# Patient Record
Sex: Female | Born: 1945 | Race: White | Hispanic: No | Smoking: Never smoker
Health system: Southern US, Community
[De-identification: ages and names within clinical notes are randomized; demographics above are authoritative.]

## PROBLEM LIST (undated history)

## (undated) DIAGNOSIS — I10 Essential (primary) hypertension: Secondary | ICD-10-CM

## (undated) DIAGNOSIS — M1990 Unspecified osteoarthritis, unspecified site: Secondary | ICD-10-CM

## (undated) DIAGNOSIS — F329 Major depressive disorder, single episode, unspecified: Secondary | ICD-10-CM

## (undated) DIAGNOSIS — K219 Gastro-esophageal reflux disease without esophagitis: Secondary | ICD-10-CM

## (undated) DIAGNOSIS — D649 Anemia, unspecified: Secondary | ICD-10-CM

## (undated) DIAGNOSIS — F419 Anxiety disorder, unspecified: Secondary | ICD-10-CM

## (undated) DIAGNOSIS — E785 Hyperlipidemia, unspecified: Secondary | ICD-10-CM

## (undated) DIAGNOSIS — F32A Depression, unspecified: Secondary | ICD-10-CM

## (undated) DIAGNOSIS — J189 Pneumonia, unspecified organism: Secondary | ICD-10-CM

## (undated) DIAGNOSIS — L409 Psoriasis, unspecified: Secondary | ICD-10-CM

## (undated) DIAGNOSIS — Z9189 Other specified personal risk factors, not elsewhere classified: Secondary | ICD-10-CM

## (undated) DIAGNOSIS — H269 Unspecified cataract: Secondary | ICD-10-CM

## (undated) DIAGNOSIS — IMO0002 Reserved for concepts with insufficient information to code with codable children: Secondary | ICD-10-CM

## (undated) DIAGNOSIS — M329 Systemic lupus erythematosus, unspecified: Secondary | ICD-10-CM

## (undated) DIAGNOSIS — N393 Stress incontinence (female) (male): Secondary | ICD-10-CM

## (undated) HISTORY — DX: Other specified personal risk factors, not elsewhere classified: Z91.89

## (undated) HISTORY — DX: Anxiety disorder, unspecified: F41.9

## (undated) HISTORY — DX: Anemia, unspecified: D64.9

## (undated) HISTORY — PX: TOTAL HIP ARTHROPLASTY: SHX124

## (undated) HISTORY — DX: Unspecified cataract: H26.9

## (undated) HISTORY — PX: CATARACT EXTRACTION W/ INTRAOCULAR LENS IMPLANT: SHX1309

## (undated) HISTORY — DX: Systemic lupus erythematosus, unspecified: M32.9

## (undated) HISTORY — DX: Reserved for concepts with insufficient information to code with codable children: IMO0002

## (undated) HISTORY — PX: AUGMENTATION MAMMAPLASTY: SUR837

## (undated) HISTORY — DX: Pneumonia, unspecified organism: J18.9

## (undated) HISTORY — PX: CATARACT EXTRACTION: SUR2

---

## 1999-04-04 ENCOUNTER — Emergency Department (HOSPITAL_COMMUNITY): Admission: EM | Admit: 1999-04-04 | Discharge: 1999-04-04 | Payer: Self-pay | Admitting: Emergency Medicine

## 2000-06-04 HISTORY — PX: PUBOVAGINAL SLING: SHX1035

## 2000-12-18 ENCOUNTER — Encounter: Admission: RE | Admit: 2000-12-18 | Discharge: 2000-12-18 | Payer: Self-pay | Admitting: Obstetrics and Gynecology

## 2000-12-18 ENCOUNTER — Encounter: Payer: Self-pay | Admitting: Obstetrics and Gynecology

## 2008-12-30 ENCOUNTER — Encounter: Admission: RE | Admit: 2008-12-30 | Discharge: 2008-12-30 | Payer: Self-pay | Admitting: Endocrinology

## 2010-12-07 ENCOUNTER — Other Ambulatory Visit: Payer: Self-pay | Admitting: Endocrinology

## 2010-12-07 DIAGNOSIS — Z1231 Encounter for screening mammogram for malignant neoplasm of breast: Secondary | ICD-10-CM

## 2010-12-14 ENCOUNTER — Ambulatory Visit
Admission: RE | Admit: 2010-12-14 | Discharge: 2010-12-14 | Disposition: A | Discharge status: Home / Self Care | Payer: BC Managed Care – PPO | Source: Ambulatory Visit | Attending: Endocrinology | Admitting: Endocrinology

## 2010-12-14 DIAGNOSIS — Z1231 Encounter for screening mammogram for malignant neoplasm of breast: Secondary | ICD-10-CM

## 2011-09-17 ENCOUNTER — Other Ambulatory Visit: Payer: Self-pay | Admitting: Urology

## 2011-09-17 ENCOUNTER — Encounter (HOSPITAL_BASED_OUTPATIENT_CLINIC_OR_DEPARTMENT_OTHER): Payer: Self-pay | Admitting: *Deleted

## 2011-09-17 NOTE — Progress Notes (Signed)
NPO AFTER MN. ARRIVES AT 0900. NEED CBC, BMET, PT/PTT , T & S , AND EKG. WILL TAKE TAGAMENT AM OF SURG. W/ SIP OF WATER.

## 2011-09-19 NOTE — H&P (Signed)
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 Her stress incontinence affects her quality of life. She has mild frequency and nocturia. She has had a bladder suspension by Dr. Patsi Sears. She had a mild positive cough test after cystoscopy. She had an abdominal incision in keeping with a prior pubovaginal sling. She emptied efficiently on her first visit. She did not have a lot of hypermobility on her initial examination. Review of systems: No change in bowel or neurologic status.   She was here to discuss her urodynamics. On uroflowmetry she voided 109 mL with a maximum flow of 15 mL per second and her residual was 5 mL. Her maximum capacity was 475 mL. Bladder was stable. She had a little bit of discomfort at maximum capacity. Her Valsalva leak-point pressure ranged between 26 and 55 cm of water with moderate leakage. She had severe leakage at 200 mL at a pressure of 54 cm of water. During voiding she voided 474 mL with a maximum flow of 9 mL per second. Maximum voiding pressure was 7 cm of water. EMG activity was within normal limits during the voiding phase with a mild increase in activity. Bladder neck descended 2-3 cm. I agree there may be a little bit of beaking of her bladder neck. The details of the urodynamics are signed and dictated on the urodynamic sheet.    Past Medical History Problems  1. History of  Anxiety (Symptom) 300.00 2. History of  Depression 311 3. History of  Hypercholesterolemia 272.0 4. History of  Hypertension 401.9  Surgical History Problems  1. History of  Bladder Surgery  Current Meds 1. Cymbalta 60 MG Oral Capsule Delayed Release Particles; Therapy: 12Jul2012 to 2. Hydrochlorothiazide 25 MG Oral Tablet; Therapy: 12Jul2012 to 3. Premarin 0.625 MG/GM Vaginal Cream; Therapy: 24Oct2012 to 4. Ramipril 10 MG Oral Capsule; Therapy: 21May2012 to 5. Vytorin 10-40 MG Oral Tablet; Therapy: 15Nov2011 to  Allergies Medication  1. No Known  Drug Allergies  Family History Problems  1. Maternal history of  Breast Cancer V16.3 2. Paternal history of  Cardiac Arrest 3. Paternal history of  Death In The Family Father father passed @ age 69heart attack 4. Maternal history of  Death In The Family Mother mother passed @ age 55cancer 5. Son's history of  Diabetes Mellitus V18.0 6. Family history of  Family Health Status Number Of Children 1 son1 daughter  Social History Problems  1. Alcohol Use socially 2. Caffeine Use 3 drinks daily 3. Marital History - Divorced V61.03 4. Never A Smoker 5. Occupation: [employer] Denied  6. History of  Tobacco Use  Assessment Assessed  1. Urge And Stress Incontinence 788.33 2. Urinary Frequency 788.41  Plan Urge And Stress Incontinence (788.33)  1. Follow-up Schedule Surgery Office  Follow-up  Done: 28Jan2013  Discussion/Summary   I drew Ms. Harl a picture. She has mild mixed incontinence but predominantly stress incontinence. I talked to her about behavioral therapy and medical therapy and physical therapy. I talked to her about watchful waiting. We talked about a sling.   We talked about a sling in detail. Pros, cons, general surgical and anesthetic risks, and other options including behavioral therapy and watchful waiting were discussed. She understands that slings are generally successful in 90% of cases for stress incontinence, 50% for urge incontinence, and that in a small percentage of cases the incontinence can worsen. The risk of persistent, de novo, or worsening incontinence/dysfunction was discussed. Risks were described but not limited  to the discussion of injury to neighboring structures including the bowel (with possible life-threatening sepsis and colostomy), bladder, urethra, vagina (all resulting in further surgery), and ureter (resulting in re-implantation). We also talked about the risk of retention requiring urethrolysis, extrusion requiring revision, and erosion  resulting in further surgery. Bleeding risks and transfusion rates and the risk of infection were discussed. The risk of pelvic and abdominal pain syndromes, dyspareunia, and neuropathies were discussed. The need for CIC was described as well the usual post-operative course. The patient understands that she might not reach her treatment goal and that she might be worse following surgery.   Ms. Remsen would like to proceed with her surgery. She said when she had her operation she had a suprapubic tube for 10 days. I had talked to her about the additional risk of bladder injury and I thought it was prudent to recommend a Monarc sling as opposed to a SPARC. I want to minimize the risk associated with retropubic scarring. Though she does have lower leak-point pressures, her incontinence overall is milder. Hopefully the operation will reach her treatment goal. The additional risk of pain with intercourse or thigh abduction was discussed.

## 2011-09-20 ENCOUNTER — Ambulatory Visit (HOSPITAL_BASED_OUTPATIENT_CLINIC_OR_DEPARTMENT_OTHER): Payer: BC Managed Care – PPO | Admitting: Anesthesiology

## 2011-09-20 ENCOUNTER — Encounter (HOSPITAL_BASED_OUTPATIENT_CLINIC_OR_DEPARTMENT_OTHER): Payer: Self-pay | Admitting: Anesthesiology

## 2011-09-20 ENCOUNTER — Ambulatory Visit (HOSPITAL_BASED_OUTPATIENT_CLINIC_OR_DEPARTMENT_OTHER)
Admission: RE | Admit: 2011-09-20 | Discharge: 2011-09-20 | Disposition: A | Discharge status: Home / Self Care | Payer: BC Managed Care – PPO | Source: Ambulatory Visit | Attending: Urology | Admitting: Urology

## 2011-09-20 ENCOUNTER — Encounter (HOSPITAL_BASED_OUTPATIENT_CLINIC_OR_DEPARTMENT_OTHER)
Admission: RE | Disposition: A | Discharge status: Home / Self Care | Payer: Self-pay | Source: Ambulatory Visit | Attending: Urology

## 2011-09-20 ENCOUNTER — Encounter (HOSPITAL_BASED_OUTPATIENT_CLINIC_OR_DEPARTMENT_OTHER): Payer: Self-pay | Admitting: *Deleted

## 2011-09-20 DIAGNOSIS — Z79899 Other long term (current) drug therapy: Secondary | ICD-10-CM | POA: Insufficient documentation

## 2011-09-20 DIAGNOSIS — N3946 Mixed incontinence: Secondary | ICD-10-CM | POA: Insufficient documentation

## 2011-09-20 DIAGNOSIS — N8111 Cystocele, midline: Secondary | ICD-10-CM | POA: Insufficient documentation

## 2011-09-20 DIAGNOSIS — I10 Essential (primary) hypertension: Secondary | ICD-10-CM | POA: Insufficient documentation

## 2011-09-20 DIAGNOSIS — E7800 Pure hypercholesterolemia, unspecified: Secondary | ICD-10-CM | POA: Insufficient documentation

## 2011-09-20 HISTORY — DX: Gastro-esophageal reflux disease without esophagitis: K21.9

## 2011-09-20 HISTORY — DX: Psoriasis, unspecified: L40.9

## 2011-09-20 HISTORY — DX: Major depressive disorder, single episode, unspecified: F32.9

## 2011-09-20 HISTORY — DX: Depression, unspecified: F32.A

## 2011-09-20 HISTORY — DX: Essential (primary) hypertension: I10

## 2011-09-20 HISTORY — DX: Hyperlipidemia, unspecified: E78.5

## 2011-09-20 HISTORY — DX: Stress incontinence (female) (male): N39.3

## 2011-09-20 HISTORY — PX: PUBOVAGINAL SLING: SHX1035

## 2011-09-20 HISTORY — PX: CYSTOSCOPY: SHX5120

## 2011-09-20 LAB — BASIC METABOLIC PANEL
Calcium: 9.5 mg/dL (ref 8.4–10.5)
Creatinine, Ser: 0.89 mg/dL (ref 0.50–1.10)
GFR calc Af Amer: 78 mL/min — ABNORMAL LOW (ref >90)

## 2011-09-20 LAB — CBC
MCHC: 33.4 g/dL (ref 30.0–36.0)
RDW: 13 % (ref 11.5–15.5)

## 2011-09-20 LAB — ABO/RH: ABO/RH(D): O NEG

## 2011-09-20 LAB — TYPE AND SCREEN
ABO/RH(D): O NEG
Antibody Screen: NEGATIVE

## 2011-09-20 LAB — APTT: aPTT: 31 seconds (ref 24–37)

## 2011-09-20 SURGERY — CYSTOSCOPY
Anesthesia: General | Site: Vagina | Wound class: Clean Contaminated

## 2011-09-20 MED ORDER — STERILE WATER FOR IRRIGATION IR SOLN
Status: DC | PRN
  Administered 2011-09-20: 3000 mL

## 2011-09-20 MED ORDER — HYDROCODONE-ACETAMINOPHEN 5-325 MG PO TABS
1.0000 | ORAL_TABLET | Freq: Four times a day (QID) | ORAL | Status: DC | PRN
  Administered 2011-09-20: 1 via ORAL

## 2011-09-20 MED ORDER — HYDROCODONE-ACETAMINOPHEN 5-500 MG PO TABS: 1.0000 | ORAL_TABLET | Freq: Four times a day (QID) | ORAL | Status: AC | PRN

## 2011-09-20 MED ORDER — LIDOCAINE HCL (CARDIAC) 20 MG/ML IV SOLN
INTRAVENOUS | Status: DC | PRN
  Administered 2011-09-20: 50 mg via INTRAVENOUS

## 2011-09-20 MED ORDER — LIDOCAINE-EPINEPHRINE (PF) 1 %-1:200000 IJ SOLN
INTRAMUSCULAR | Status: DC | PRN
  Administered 2011-09-20: 7 mL

## 2011-09-20 MED ORDER — MIDAZOLAM HCL 5 MG/5ML IJ SOLN
INTRAMUSCULAR | Status: DC | PRN
  Administered 2011-09-20: 2 mg via INTRAVENOUS

## 2011-09-20 MED ORDER — ONDANSETRON HCL 4 MG/2ML IJ SOLN
INTRAMUSCULAR | Status: DC | PRN
  Administered 2011-09-20: 4 mg via INTRAVENOUS

## 2011-09-20 MED ORDER — DEXAMETHASONE SODIUM PHOSPHATE 4 MG/ML IJ SOLN
INTRAMUSCULAR | Status: DC | PRN
  Administered 2011-09-20: 10 mg via INTRAVENOUS

## 2011-09-20 MED ORDER — PROPOFOL 10 MG/ML IV EMUL
INTRAVENOUS | Status: DC | PRN
  Administered 2011-09-20: 200 mg via INTRAVENOUS
  Administered 2011-09-20: 30 mg via INTRAVENOUS

## 2011-09-20 MED ORDER — SODIUM CHLORIDE 0.9 % IR SOLN
Status: DC | PRN
  Administered 2011-09-20: 08:00:00

## 2011-09-20 MED ORDER — CIPROFLOXACIN HCL 250 MG PO TABS: 250.0000 mg | ORAL_TABLET | Freq: Two times a day (BID) | ORAL | Status: AC

## 2011-09-20 MED ORDER — SODIUM CHLORIDE 0.9 % IV SOLN
1.5000 g | INTRAVENOUS | Status: AC
  Administered 2011-09-20: 1.5 g via INTRAVENOUS

## 2011-09-20 MED ORDER — PROMETHAZINE HCL 25 MG/ML IJ SOLN: 6.2500 mg | INTRAMUSCULAR | Status: DC | PRN

## 2011-09-20 MED ORDER — GENTAMICIN IN SALINE 1.6-0.9 MG/ML-% IV SOLN
80.0000 mg | INTRAVENOUS | Status: AC
  Administered 2011-09-20: 80 mg via INTRAVENOUS

## 2011-09-20 MED ORDER — FENTANYL CITRATE 0.05 MG/ML IJ SOLN
INTRAMUSCULAR | Status: DC | PRN
  Administered 2011-09-20: 25 ug via INTRAVENOUS
  Administered 2011-09-20: 100 ug via INTRAVENOUS
  Administered 2011-09-20: 25 ug via INTRAVENOUS

## 2011-09-20 MED ORDER — FENTANYL CITRATE 0.05 MG/ML IJ SOLN: 25.0000 ug | INTRAMUSCULAR | Status: DC | PRN

## 2011-09-20 MED ORDER — PHENYLEPHRINE HCL 10 MG/ML IJ SOLN
INTRAMUSCULAR | Status: DC | PRN
  Administered 2011-09-20 (×6): 100 ug via INTRAVENOUS
  Administered 2011-09-20 (×2): 200 ug via INTRAVENOUS
  Administered 2011-09-20: 100 ug via INTRAVENOUS

## 2011-09-20 MED ORDER — INDIGOTINDISULFONATE SODIUM 8 MG/ML IJ SOLN
INTRAMUSCULAR | Status: DC | PRN
  Administered 2011-09-20 (×2): 5 mL via INTRAVENOUS

## 2011-09-20 MED ORDER — EPHEDRINE SULFATE 50 MG/ML IJ SOLN
INTRAMUSCULAR | Status: DC | PRN
  Administered 2011-09-20 (×3): 10 mg via INTRAVENOUS

## 2011-09-20 MED ORDER — LACTATED RINGERS IV SOLN
INTRAVENOUS | Status: DC
  Administered 2011-09-20 (×3): via INTRAVENOUS

## 2011-09-20 SURGICAL SUPPLY — 71 items
ADAPTER CATH URET PLST 4-6FR (CATHETERS) IMPLANT
ADH SKN CLS APL DERMABOND .7 (GAUZE/BANDAGES/DRESSINGS) ×2
ADPR CATH URET STRL DISP 4-6FR (CATHETERS)
BAG DRAIN URO-CYSTO SKYTR STRL (DRAIN) ×3 IMPLANT
BAG DRN UROCATH (DRAIN) ×2
BAG URINE DRAINAGE (UROLOGICAL SUPPLIES) ×1 IMPLANT
BALLN NEPHROSTOMY (BALLOONS)
BALLOON NEPHROSTOMY (BALLOONS) IMPLANT
BLADE HEX COATED 2.75 (ELECTRODE) ×3 IMPLANT
BLADE SURG 10 STRL SS (BLADE) ×3 IMPLANT
BLADE SURG 15 STRL LF DISP TIS (BLADE) ×2 IMPLANT
BLADE SURG 15 STRL SS (BLADE) ×3
BLADE SURG ROTATE 9660 (MISCELLANEOUS) ×3 IMPLANT
CANISTER SUCT LVC 12 LTR MEDI- (MISCELLANEOUS) IMPLANT
CANISTER SUCTION 1200CC (MISCELLANEOUS) ×3 IMPLANT
CATH FOLEY 2WAY SLVR  5CC 16FR (CATHETERS) ×1
CATH FOLEY 2WAY SLVR 5CC 16FR (CATHETERS) ×2 IMPLANT
CATH ROBINSON RED A/P 14FR (CATHETERS) ×2 IMPLANT
CATH URET 5FR 28IN CONE TIP (BALLOONS)
CATH URET 5FR 70CM CONE TIP (BALLOONS) IMPLANT
CLOTH BEACON ORANGE TIMEOUT ST (SAFETY) ×3 IMPLANT
COVER MAYO STAND STRL (DRAPES) ×5 IMPLANT
COVER TABLE BACK 60X90 (DRAPES) ×3 IMPLANT
DERMABOND ADVANCED (GAUZE/BANDAGES/DRESSINGS) ×1
DERMABOND ADVANCED .7 DNX12 (GAUZE/BANDAGES/DRESSINGS) ×2 IMPLANT
DRAIN PENROSE 18X1/4 LTX STRL (WOUND CARE) IMPLANT
DRAPE CAMERA CLOSED 9X96 (DRAPES) ×2 IMPLANT
DRAPE UNDERBUTTOCKS STRL (DRAPE) ×3 IMPLANT
ELECT REM PT RETURN 9FT ADLT (ELECTROSURGICAL) ×3
ELECTRODE REM PT RTRN 9FT ADLT (ELECTROSURGICAL) ×2 IMPLANT
GAUZE SPONGE 4X4 16PLY XRAY LF (GAUZE/BANDAGES/DRESSINGS) ×1 IMPLANT
GLOVE BIO SURGEON STRL SZ7.5 (GLOVE) ×7 IMPLANT
GLOVE INDICATOR 6.5 STRL GRN (GLOVE) ×2 IMPLANT
GLOVE SS BIOGEL STRL SZ 8.5 (GLOVE) IMPLANT
GLOVE SUPERSENSE BIOGEL SZ 8.5 (GLOVE)
GOWN PREVENTION PLUS LG XLONG (DISPOSABLE) ×3 IMPLANT
GOWN STRL NON-REIN LRG LVL3 (GOWN DISPOSABLE) ×6 IMPLANT
GOWN STRL REIN XL XLG (GOWN DISPOSABLE) ×3 IMPLANT
GUIDEWIRE STR DUAL SENSOR (WIRE) ×2 IMPLANT
HOLDER FOLEY CATH W/STRAP (MISCELLANEOUS) ×2 IMPLANT
HOOK RETRACTION 12 ELAST STAY (MISCELLANEOUS) IMPLANT
NDL SAFETY ECLIPSE 18X1.5 (NEEDLE) IMPLANT
NEEDLE HYPO 18GX1.5 SHARP (NEEDLE)
NEEDLE HYPO 22GX1.5 SAFETY (NEEDLE) ×3 IMPLANT
NS IRRIG 500ML POUR BTL (IV SOLUTION) IMPLANT
PACK BASIN DAY SURGERY FS (CUSTOM PROCEDURE TRAY) ×3 IMPLANT
PACK CYSTOSCOPY (CUSTOM PROCEDURE TRAY) ×2 IMPLANT
PACKING VAGINAL (PACKING) ×3 IMPLANT
PENCIL BUTTON HOLSTER BLD 10FT (ELECTRODE) ×3 IMPLANT
PLUG CATH AND CAP STER (CATHETERS) ×3 IMPLANT
RETRACTOR STERILE 25.8CMX11.3 (INSTRUMENTS) IMPLANT
SET IRRIG Y TYPE TUR BLADDER L (SET/KITS/TRAYS/PACK) ×3 IMPLANT
SHEET LAVH (DRAPES) ×3 IMPLANT
SUBFASCIAL HAMMOCK MONARCH (Sling) ×1 IMPLANT
SURGILUBE 2OZ TUBE FLIPTOP (MISCELLANEOUS) ×2 IMPLANT
SUT SILK 2 0 30  PSL (SUTURE)
SUT SILK 2 0 30 PSL (SUTURE) IMPLANT
SUT VIC AB 2-0 CT1 27 (SUTURE) ×12
SUT VIC AB 2-0 CT1 TAPERPNT 27 (SUTURE) ×4 IMPLANT
SUT VICRYL 4-0 PS2 18IN ABS (SUTURE) ×3 IMPLANT
SYR 20CC LL (SYRINGE) IMPLANT
SYR BULB IRRIGATION 50ML (SYRINGE) ×3 IMPLANT
SYR CONTROL 10ML LL (SYRINGE) ×3 IMPLANT
SYRINGE 10CC LL (SYRINGE) ×3 IMPLANT
SYRINGE IRR TOOMEY STRL 70CC (SYRINGE) IMPLANT
TOWEL OR 17X24 6PK STRL BLUE (TOWEL DISPOSABLE) ×4 IMPLANT
TRAY DSU PREP LF (CUSTOM PROCEDURE TRAY) ×3 IMPLANT
TUBE CONNECTING 12X1/4 (SUCTIONS) ×6 IMPLANT
WATER STERILE IRR 3000ML UROMA (IV SOLUTION) ×3 IMPLANT
WATER STERILE IRR 500ML POUR (IV SOLUTION) IMPLANT
YANKAUER SUCT BULB TIP NO VENT (SUCTIONS) ×3 IMPLANT

## 2011-09-20 NOTE — Op Note (Signed)
Preoperative diagnosis: Stress urinary incontinence Postoperative diagnosis: Stress urinary continence Surgery: Sling cystourethropexy (Monarc) plus cystoscopy Surgeon: Dr. Lorin Picket Caroline Martin  Caroline Martin has a pubovaginal sling and a suprapubic tube. I elected to do a trans-obturator sling and she consented to the above procedure. The patient was prepped and draped in the usual fashion. Preoperative laboratory tests were normal. Preoperative antibiotics were given.  Extra care was taken with leg positioning to minimize the risk of compartment syndrome, neuropathy, and deep vein thrombosis. 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 I instilled approximately 5 cc of a lidocaine epinephrine mixture and one could tell that there was scarring with not a lot of rapid expansion of the soft tissues and mucosa. With good visibility I made an appropriate depth suburethral sling approxi-2 cm in length and bluntly dissected to the ischiopubic rami bilaterally. I could place my finger to the correct position in feel the undersurface of the ischiopubic rami.  Prior to the operation and at this point I felt all landmarks including the abductor tendon and the upper aspect of the obturator foramen below the tendon and at the level of the clitoris bilaterally and these areas were marked and triple checked. I made 2 small stab incisions.  With the bladder emptied I did a double pop maneuver passing the trocar around the ischio- pubic rami onto the pulp of my index finger which was visibly and palpably placed in the correct position. I had mimicked the angle prior. It was easy to deliver the tip of the trocar and attach the sling in an anatomic position. The exact same maneuver was done on the patient's right side.  Before tensioning the sling I did a cystoscopy. There was no injury to bladder bladder neck or urethra and there was excellent blue jets bilaterally  With the  dissection and scarred tight anterior vaginal wall mucosa the 2 cm incision split laterally in both directions almost to the pelvic sidewall. I spent many minutes making certain that I had not buttoned hole the vaginal mucosa at the fornix of the vagina and that the sling was laid in nicely and would  be covered nicely by her mucosa.  I then tensioned the sling over the fat part of a moderate size Kelly clamp. I cut below  the blue dots and irrigated the sheaths and removed them. I was very pleased that the sling was in the mid urethra with appropriate hyper mobility and no springback effect. It was in the mid urethra. This was double checked.  I then spent several minutes closing the anterior vaginal wall, first the tear on the right and then on the left. This reestablished the initial midline incision which was closed with running 2-0 Vicryl on a CT1 needle. My usual interrupted sutures were used for all 3 areas of closure.  I inspected carefully with a headlight that was added midway through the case to make certain once again that everything would close properly with good coverage of the sling I was pleased with this. The tissues had split even more superficially to the level of the urethral meatus but did not need formal closure since it was so superficial. More irrigation was utilized throughout the case. I cut the sling below the skin and closed the inguinal incisions with interrupted 4-0 Vicryl followed by Dermabond. Bladder was emptied. Vaginal pack was inserted. I was pleased with the procedure and hopefully it will reach the patient's treatment  goal

## 2011-09-20 NOTE — Anesthesia Procedure Notes (Signed)
Procedure Name: LMA Insertion Date/Time: 09/20/2011 7:40 AM Performed by: Norva Pavlov Pre-anesthesia Checklist: Patient identified, Emergency Drugs available, Suction available and Patient being monitored Patient Re-evaluated:Patient Re-evaluated prior to inductionOxygen Delivery Method: Circle System Utilized Preoxygenation: Pre-oxygenation with 100% oxygen Intubation Type: IV induction Ventilation: Mask ventilation without difficulty LMA: LMA inserted LMA Size: 4.0 Number of attempts: 1 Airway Equipment and Method: bite block Placement Confirmation: positive ETCO2 Tube secured with: Tape Dental Injury: Teeth and Oropharynx as per pre-operative assessment

## 2011-09-20 NOTE — Anesthesia Postprocedure Evaluation (Signed)
  Anesthesia Post-op Note  Patient: Caroline Martin  Procedure(s) Performed: Procedure(s) (LRB): CYSTOSCOPY (N/A) PUBO-VAGINAL SLING (N/A)  Patient Location: PACU  Anesthesia Type: General  Level of Consciousness: awake and alert   Airway and Oxygen Therapy: Patient Spontanous Breathing  Post-op Pain: mild  Post-op Assessment: Post-op Vital signs reviewed, Patient's Cardiovascular Status Stable, Respiratory Function Stable, Patent Airway and No signs of Nausea or vomiting  Post-op Vital Signs: stable  Complications: No apparent anesthesia complications

## 2011-09-20 NOTE — Discharge Instructions (Signed)
I have reviewed discharge instructions in detail with the patient. They will follow-up with me or their physician as scheduled. My nurse will also be calling the patients as per protocol.   Post Anesthesia Home Care Instructions  Activity: Get plenty of rest for the remainder of the day. A responsible adult should stay with you for 24 hours following the procedure.  For the next 24 hours, DO NOT: -Drive a car -Operate machinery -Drink alcoholic beverages -Take any medication unless instructed by your physician -Make any legal decisions or sign important papers.  Meals: Start with liquid foods such as gelatin or soup. Progress to regular foods as tolerated. Avoid greasy, spicy, heavy foods. If nausea and/or vomiting occur, drink only clear liquids until the nausea and/or vomiting subsides. Call your physician if vomiting continues.  Special Instructions/Symptoms: Your throat may feel dry or sore from the anesthesia or the breathing tube placed in your throat during surgery. If this causes discomfort, gargle with warm salt water. The discomfort should disappear within 24 hours.   

## 2011-09-20 NOTE — Progress Notes (Signed)
Bladder scan noted 19ml urine. Dr Sherron Monday paged

## 2011-09-20 NOTE — Interval H&P Note (Signed)
History and Physical Interval Note:  09/20/2011 6:54 AM  Caroline Martin  has presented today for surgery, with the diagnosis of stress incontinence  The various methods of treatment have been discussed with the patient and family. After consideration of risks, benefits and other options for treatment, the patient has consented to  Procedure(s) (LRB): CYSTOSCOPY (N/A) PUBO-VAGINAL SLING (N/A) as a surgical intervention .  The patients' history has been reviewed, patient examined, no change in status, stable for surgery.  I have reviewed the patients' chart and labs.  Questions were answered to the patient's satisfaction.     Elene Downum A

## 2011-09-20 NOTE — Progress Notes (Signed)
Dr Sherron Monday in to see pt.

## 2011-09-20 NOTE — Progress Notes (Signed)
Dr Sherron Monday notified of 19 ml residual urine from bladder scan. OK to be D/C

## 2011-09-20 NOTE — Anesthesia Preprocedure Evaluation (Signed)
Anesthesia Evaluation  Patient identified by MRN, date of birth, ID band Patient awake    Reviewed: Allergy & Precautions, H&P , NPO status , Patient's Chart, lab work & pertinent test results  Airway Mallampati: II TM Distance: >3 FB Neck ROM: Full    Dental No notable dental hx.    Pulmonary neg pulmonary ROS,  breath sounds clear to auscultation  Pulmonary exam normal       Cardiovascular hypertension, Pt. on medications Rhythm:Regular Rate:Normal     Neuro/Psych PSYCHIATRIC DISORDERS Depression negative neurological ROS     GI/Hepatic Neg liver ROS, GERD-  Medicated,  Endo/Other  negative endocrine ROS  Renal/GU negative Renal ROS  negative genitourinary   Musculoskeletal negative musculoskeletal ROS (+)   Abdominal   Peds negative pediatric ROS (+)  Hematology negative hematology ROS (+)   Anesthesia Other Findings   Reproductive/Obstetrics negative OB ROS                           Anesthesia Physical Anesthesia Plan  ASA: II  Anesthesia Plan: General   Post-op Pain Management:    Induction: Intravenous  Airway Management Planned: LMA  Additional Equipment:   Intra-op Plan:   Post-operative Plan: Extubation in OR  Informed Consent: I have reviewed the patients History and Physical, chart, labs and discussed the procedure including the risks, benefits and alternatives for the proposed anesthesia with the patient or authorized representative who has indicated his/her understanding and acceptance.   Dental advisory given  Plan Discussed with: CRNA  Anesthesia Plan Comments: (Lma versus ETT)        Anesthesia Quick Evaluation

## 2011-09-20 NOTE — Transfer of Care (Signed)
Immediate Anesthesia Transfer of Care Note  Patient: Caroline Martin  Procedure(s) Performed: Procedure(s) (LRB): CYSTOSCOPY (N/A) PUBO-VAGINAL SLING (N/A)  Patient Location: PACU  Anesthesia Type: General  Level of Consciousness: drowsy, arouses to name, follows commands  Airway & Oxygen Therapy: Patient Spontanous Breathing and Patient connected to face mask oxygen  Post-op Assessment: Report given to PACU RN and Post -op Vital signs reviewed and stable  Post vital signs: Reviewed and stable  Complications: No apparent anesthesia complications

## 2011-09-26 ENCOUNTER — Encounter (HOSPITAL_BASED_OUTPATIENT_CLINIC_OR_DEPARTMENT_OTHER): Payer: Self-pay | Admitting: Urology

## 2012-04-02 ENCOUNTER — Other Ambulatory Visit: Payer: Self-pay | Admitting: Endocrinology

## 2012-04-02 DIAGNOSIS — Z1231 Encounter for screening mammogram for malignant neoplasm of breast: Secondary | ICD-10-CM

## 2013-01-14 ENCOUNTER — Ambulatory Visit
Admission: RE | Admit: 2013-01-14 | Discharge: 2013-01-14 | Disposition: A | Discharge status: Home / Self Care | Payer: Medicare HMO | Source: Ambulatory Visit | Attending: Endocrinology | Admitting: Endocrinology

## 2013-01-14 DIAGNOSIS — Z1231 Encounter for screening mammogram for malignant neoplasm of breast: Secondary | ICD-10-CM

## 2013-03-19 ENCOUNTER — Ambulatory Visit: Payer: Self-pay | Admitting: Gynecology

## 2013-04-07 ENCOUNTER — Ambulatory Visit: Payer: Self-pay | Admitting: Certified Nurse Midwife

## 2013-05-06 ENCOUNTER — Ambulatory Visit: Payer: Self-pay | Admitting: Gynecology

## 2013-06-17 ENCOUNTER — Ambulatory Visit: Payer: Self-pay | Admitting: Gynecology

## 2013-06-18 ENCOUNTER — Ambulatory Visit (INDEPENDENT_AMBULATORY_CARE_PROVIDER_SITE_OTHER): Payer: Medicare PPO | Admitting: Gynecology

## 2013-06-18 ENCOUNTER — Encounter: Payer: Self-pay | Admitting: Gynecology

## 2013-06-18 VITALS — BP 124/70 | Ht 64.0 in | Wt 199.0 lb

## 2013-06-18 DIAGNOSIS — N952 Postmenopausal atrophic vaginitis: Secondary | ICD-10-CM

## 2013-06-18 DIAGNOSIS — N898 Other specified noninflammatory disorders of vagina: Secondary | ICD-10-CM

## 2013-06-18 DIAGNOSIS — Z780 Asymptomatic menopausal state: Secondary | ICD-10-CM

## 2013-06-18 DIAGNOSIS — N9489 Other specified conditions associated with female genital organs and menstrual cycle: Secondary | ICD-10-CM

## 2013-06-18 NOTE — Patient Instructions (Addendum)
Followup for a bone density as scheduled.  Start on vaginal estrogen cream twice weekly. Call me if you have any issues with this. Call me if you do any vaginal bleeding.  Schedule [facility] with [facility] at [phone] or [facility] at [phone]

## 2013-06-18 NOTE — Progress Notes (Signed)
Caroline Martin 11-15-1945 [id number]        68 y.o.  [id number] new patient for followup exam.  Former patient of [facility]. Several issues noted below.  Past medical history,surgical history, problem list, medications, allergies, family history and social history were all reviewed and documented in the EPIC chart.  ROS:  Performed and pertinent positives and negatives are included in the history, assessment and plan .  Exam: Kim assistant Filed Vitals:   06/18/13 1424  BP: 124/70  Height: 5\' 4"  (1.626 m)  Weight: 199 lb (90.266 kg)   General appearance  Normal Skin grossly normal Head/Neck normal with no cervical or supraclavicular adenopathy thyroid normal Lungs  clear Cardiac RR, without RMG Abdominal  soft, nontender, without masses, organomegaly or hernia Breasts  examined lying and sitting without masses, retractions, discharge or axillary adenopathy. Bilateral implants noted Pelvic  Ext/BUS/vagina  Normal with atrophic genital changes  Cervix  Normal with atrophic changes  Uterus  axial to anteverted, normal size, shape and contour, midline and mobile nontender   Adnexa  Without masses or tenderness    Anus and perineum  Normal   Rectovaginal  Normal sphincter tone without palpated masses or tenderness.    Assessment/Plan:  68 y.o. [id number] new patient for followup exam.   1. Postmenopausal/atrophic genital changes/vaginal dryness. Patient had been on Premarin vaginal cream 3 times weekly but ran out in August. She now is having daily vaginal dryness and irritation. She is not sexually active. She is not having issues such as hot flushes, night sweats or sleep disturbances.  I reviewed the whole issue of HRT with her to include the WHI study with increased risk of stroke, heart attack, DVT and breast cancer. The ACOG and NAMS statements for lowest dose for the shortest period of time reviewed. Transdermal versus oral first-pass effect benefit discussed.  Treatments  for vaginal dryness to include OTC products, vaginal estrogen cream, Bactroban and Osphena reviewed. The pros/cons, risks/benefits of these choice discussed to include potential for absorption with the above risks as well as endometrial stimulation. Patient wants to restart estrogen now and will start with bioequivalent [facility] twice weekly estradiol. Patient does report any vaginal bleeding or issues with this. 2. Pap smear reported 2013. No Pap smear done today. No history of abnormal Pap smears previously. Discussed current screening guidelines. Will plan to repeat at 3 year interval. Options to stop screening as she is over the age of 68 also discussed. We'll readdress on an annual basis. 3. Mammography 01/2013. Continue with annual mammography. SBE monthly reviewed. 4. DEXA over 10 years ago. Schedule DEXA now she agrees to do so. Calcium/vitamin D recommendations reviewed. 5. Colonoscopy never. Strongly recommended scheduling screening colonoscopy. Benefits of precancerous polyp removal and early detection reviewed. Patient agrees to schedule. Names and numbers provided. 6. Health maintenance. No routine lab work done as patient reports this is all done through her primary physician's office. Followup one year, sooner if any issues with her estrogen vaginal cream.   Note: This document was prepared with digital dictation and possible smart phrase technology. Any transcriptional errors that result from this process are unintentional.   Dara LordsFONTAINE,Caroline Martin, 3:14 PM 06/18/2013

## 2013-06-19 ENCOUNTER — Telehealth: Payer: Self-pay | Admitting: *Deleted

## 2013-06-19 LAB — URINALYSIS W MICROSCOPIC + REFLEX CULTURE
BILIRUBIN URINE: NEGATIVE
Bacteria, UA: NONE SEEN
CRYSTALS: NONE SEEN
Casts: NONE SEEN
GLUCOSE, UA: NEGATIVE mg/dL
Hgb urine dipstick: NEGATIVE
Ketones, ur: NEGATIVE mg/dL
Leukocytes, UA: NEGATIVE
Nitrite: NEGATIVE
Protein, ur: NEGATIVE mg/dL
SPECIFIC GRAVITY, URINE: 1.015 (ref 1.005–1.030)
SQUAMOUS EPITHELIAL / LPF: NONE SEEN
UROBILINOGEN UA: 0.2 mg/dL (ref 0.0–1.0)
pH: 7 (ref 5.0–8.0)

## 2013-06-19 MED ORDER — NONFORMULARY OR COMPOUNDED ITEM: Status: DC

## 2013-06-19 NOTE — Telephone Encounter (Signed)
Message copied by Aura CampsWEBB, JENNIFER L on Fri Jun 19, 2013  8:39 AM ------      Message from: Dara LordsFONTAINE, TIMOTHY P      Created: Thu Jun 18, 2013  3:20 PM       [facility] vaginal estrogen cream twice weekly three-month supply refill x1 year ------

## 2013-06-19 NOTE — Telephone Encounter (Signed)
rx called in

## 2013-09-01 ENCOUNTER — Other Ambulatory Visit: Payer: Self-pay | Admitting: Gynecology

## 2013-09-01 ENCOUNTER — Ambulatory Visit (INDEPENDENT_AMBULATORY_CARE_PROVIDER_SITE_OTHER): Payer: Medicare PPO

## 2013-09-01 DIAGNOSIS — Z780 Asymptomatic menopausal state: Secondary | ICD-10-CM

## 2014-04-05 ENCOUNTER — Encounter: Payer: Self-pay | Admitting: Gynecology

## 2014-06-22 ENCOUNTER — Encounter: Payer: Medicare PPO | Admitting: Gynecology

## 2014-08-04 ENCOUNTER — Encounter: Payer: Medicare PPO | Admitting: Gynecology

## 2014-08-11 ENCOUNTER — Encounter: Payer: Medicare PPO | Admitting: Gynecology

## 2014-08-24 ENCOUNTER — Encounter: Payer: Medicare PPO | Admitting: Gynecology

## 2014-09-27 DIAGNOSIS — H01025 Squamous blepharitis left lower eyelid: Secondary | ICD-10-CM | POA: Diagnosis not present

## 2014-09-27 DIAGNOSIS — H01022 Squamous blepharitis right lower eyelid: Secondary | ICD-10-CM | POA: Diagnosis not present

## 2014-09-27 DIAGNOSIS — H0014 Chalazion left upper eyelid: Secondary | ICD-10-CM | POA: Diagnosis not present

## 2014-09-27 DIAGNOSIS — H01021 Squamous blepharitis right upper eyelid: Secondary | ICD-10-CM | POA: Diagnosis not present

## 2014-09-27 DIAGNOSIS — H01024 Squamous blepharitis left upper eyelid: Secondary | ICD-10-CM | POA: Diagnosis not present

## 2014-11-22 DIAGNOSIS — E668 Other obesity: Secondary | ICD-10-CM | POA: Diagnosis not present

## 2014-11-22 DIAGNOSIS — K219 Gastro-esophageal reflux disease without esophagitis: Secondary | ICD-10-CM | POA: Diagnosis not present

## 2014-11-22 DIAGNOSIS — L639 Alopecia areata, unspecified: Secondary | ICD-10-CM | POA: Diagnosis not present

## 2014-11-22 DIAGNOSIS — Z6835 Body mass index (BMI) 35.0-35.9, adult: Secondary | ICD-10-CM | POA: Diagnosis not present

## 2014-11-22 DIAGNOSIS — E785 Hyperlipidemia, unspecified: Secondary | ICD-10-CM | POA: Diagnosis not present

## 2014-11-22 DIAGNOSIS — F329 Major depressive disorder, single episode, unspecified: Secondary | ICD-10-CM | POA: Diagnosis not present

## 2014-11-22 DIAGNOSIS — I10 Essential (primary) hypertension: Secondary | ICD-10-CM | POA: Diagnosis not present

## 2014-11-22 DIAGNOSIS — Z1389 Encounter for screening for other disorder: Secondary | ICD-10-CM | POA: Diagnosis not present

## 2014-12-15 ENCOUNTER — Telehealth: Payer: Self-pay | Admitting: *Deleted

## 2014-12-15 ENCOUNTER — Encounter: Payer: Self-pay | Admitting: Gynecology

## 2014-12-15 ENCOUNTER — Ambulatory Visit (INDEPENDENT_AMBULATORY_CARE_PROVIDER_SITE_OTHER): Payer: Medicare PPO | Admitting: Gynecology

## 2014-12-15 ENCOUNTER — Other Ambulatory Visit (HOSPITAL_COMMUNITY)
Admission: RE | Admit: 2014-12-15 | Discharge: 2014-12-15 | Disposition: A | Discharge status: Home / Self Care | Payer: Medicare HMO | Source: Ambulatory Visit | Attending: Gynecology | Admitting: Gynecology

## 2014-12-15 VITALS — BP 124/84 | Ht 64.0 in | Wt 207.0 lb

## 2014-12-15 DIAGNOSIS — Z01419 Encounter for gynecological examination (general) (routine) without abnormal findings: Secondary | ICD-10-CM | POA: Diagnosis not present

## 2014-12-15 DIAGNOSIS — Z124 Encounter for screening for malignant neoplasm of cervix: Secondary | ICD-10-CM

## 2014-12-15 DIAGNOSIS — N952 Postmenopausal atrophic vaginitis: Secondary | ICD-10-CM | POA: Diagnosis not present

## 2014-12-15 DIAGNOSIS — R8299 Other abnormal findings in urine: Secondary | ICD-10-CM | POA: Diagnosis not present

## 2014-12-15 MED ORDER — NONFORMULARY OR COMPOUNDED ITEM: Status: DC

## 2014-12-15 NOTE — Patient Instructions (Signed)
Schedule your colonoscopy with either:  [facility]   Address: [address], [place], [place] [address]  [phone]    or  [facility]  Address: [address], [place], [place] [address]  [phone]      Call to Schedule your mammogram  Facilities in [place]: 1)  [facility], [address] Phone: [phone] 2)  [facility]. [facility], [address] Phone: [phone] 3)  Dr. Bertrand at [facility]  [address] Phone: [phone]     Mammogram A mammogram is an X-ray test to find changes in a woman's breast. You should get a mammogram if:  You are 40 years of age or older  You have risk factors.   Your doctor recommends that you have one.  BEFORE THE TEST  Do not schedule the test the week before your period, especially if your breasts are sore during this time.  On the day of your mammogram:  Wash your breasts and armpits well. After washing, do not put on any deodorant or talcum powder on until after your test.   Eat and drink as you usually do.   Take your medicines as usual.   If you are diabetic and take insulin, make sure you:   Eat before coming for your test.   Take your insulin as usual.   If you cannot keep your appointment, call before the appointment to cancel. Schedule another appointment.  TEST  You will need to undress from the waist up. You will put on a hospital gown.   Your breast will be put on the mammogram machine, and it will press firmly on your breast with a piece of plastic called a compression paddle. This will make your breast flatter so that the machine can X-ray all parts of your breast.   Both breasts will be X-rayed. Each breast will be X-rayed from above and from the side. An X-ray might need to be taken again if the picture is not good enough.   The mammogram will last about 15 to 30  minutes.  AFTER THE TEST Finding out the results of your test Ask when your test results will be ready. Make sure you get your test results.  Document Released: 08/17/2008 Document Revised: 05/10/2011 Document Reviewed: 08/17/2008 ExitCare Patient Information 2012 [employer].  You may obtain a copy of any labs that were done today by logging onto MyChart as outlined in the instructions provided with your AVS (after visit summary). The office will not call with normal lab results but certainly if there are any significant abnormalities then we will contact you.   Health Maintenance, Female A healthy lifestyle and preventative care can promote health and wellness.  Maintain regular health, dental, and eye exams.  Eat a healthy diet. Foods like vegetables, fruits, whole grains, low-fat dairy products, and lean protein foods contain the nutrients you need without too many calories. Decrease your intake of foods high in solid fats, added sugars, and salt. Get information about a proper diet from your caregiver, if necessary.  Regular physical exercise is one of the most important things you can do for your health. Most adults should get at least 150 minutes of moderate-intensity exercise (any activity that increases your heart rate and causes you to sweat) each week. In addition, most adults need muscle-strengthening exercises on 2 or more days a week.   Maintain a healthy   weight. The body mass index (BMI) is a screening tool to identify possible weight problems. It provides an estimate of body fat based on height and weight. Your caregiver can help determine your BMI, and can help you achieve or maintain a healthy weight. For adults 20 years and older:  A BMI below 18.5 is considered underweight.  A BMI of 18.5 to 24.9 is normal.  A BMI of 25 to 29.9 is considered overweight.  A BMI of 30 and above is considered obese.  Maintain normal blood lipids and cholesterol by exercising and  minimizing your intake of saturated fat. Eat a balanced diet with plenty of fruits and vegetables. Blood tests for lipids and cholesterol should begin at age 20 and be repeated every 5 years. If your lipid or cholesterol levels are high, you are over 50, or you are a high risk for heart disease, you may need your cholesterol levels checked more frequently.Ongoing high lipid and cholesterol levels should be treated with medicines if diet and exercise are not effective.  If you smoke, find out from your caregiver how to quit. If you do not use tobacco, do not start.  Lung cancer screening is recommended for adults aged 55 80 years who are at high risk for developing lung cancer because of a history of smoking. Yearly low-dose computed tomography (CT) is recommended for people who have at least a 30-pack-year history of smoking and are a current smoker or have quit within the past 15 years. A pack year of smoking is smoking an average of 1 pack of cigarettes a day for 1 year (for example: 1 pack a day for 30 years or 2 packs a day for 15 years). Yearly screening should continue until the smoker has stopped smoking for at least 15 years. Yearly screening should also be stopped for people who develop a health problem that would prevent them from having lung cancer treatment.  If you are pregnant, do not drink alcohol. If you are breastfeeding, be very cautious about drinking alcohol. If you are not pregnant and choose to drink alcohol, do not exceed 1 drink per day. One drink is considered to be 12 ounces (355 mL) of beer, 5 ounces (148 mL) of wine, or 1.5 ounces (44 mL) of liquor.  Avoid use of street drugs. Do not share needles with anyone. Ask for help if you need support or instructions about stopping the use of drugs.  High blood pressure causes heart disease and increases the risk of stroke. Blood pressure should be checked at least every 1 to 2 years. Ongoing high blood pressure should be treated with  medicines, if weight loss and exercise are not effective.  If you are 55 to 67 years old, ask your caregiver if you should take aspirin to prevent strokes.  Diabetes screening involves taking a blood sample to check your fasting blood sugar level. This should be done once every 3 years, after age 45, if you are within normal weight and without risk factors for diabetes. Testing should be considered at a younger age or be carried out more frequently if you are overweight and have at least 1 risk factor for diabetes.  Breast cancer screening is essential preventative care for women. You should practice "breast self-awareness." This means understanding the normal appearance and feel of your breasts and may include breast self-examination. Any changes detected, no matter how small, should be reported to a caregiver. Women in their 20s and 30s should have a clinical   breast exam (CBE) by a caregiver as part of a regular health exam every 1 to 3 years. After age 40, women should have a CBE every year. Starting at age 40, women should consider having a mammogram (breast X-ray) every year. Women who have a family history of breast cancer should talk to their caregiver about genetic screening. Women at a high risk of breast cancer should talk to their caregiver about having an MRI and a mammogram every year.  Breast cancer gene (BRCA)-related cancer risk assessment is recommended for women who have family members with BRCA-related cancers. BRCA-related cancers include breast, ovarian, tubal, and peritoneal cancers. Having family members with these cancers may be associated with an increased risk for harmful changes (mutations) in the breast cancer genes BRCA1 and BRCA2. Results of the assessment will determine the need for genetic counseling and BRCA1 and BRCA2 testing.  The Pap test is a screening test for cervical cancer. Women should have a Pap test starting at age 21. Between ages 21 and 29, Pap tests should be  repeated every 2 years. Beginning at age 30, you should have a Pap test every 3 years as long as the past 3 Pap tests have been normal. If you had a hysterectomy for a problem that was not cancer or a condition that could lead to cancer, then you no longer need Pap tests. If you are between ages 65 and 70, and you have had normal Pap tests going back 10 years, you no longer need Pap tests. If you have had past treatment for cervical cancer or a condition that could lead to cancer, you need Pap tests and screening for cancer for at least 20 years after your treatment. If Pap tests have been discontinued, risk factors (such as a new sexual partner) need to be reassessed to determine if screening should be resumed. Some women have medical problems that increase the chance of getting cervical cancer. In these cases, your caregiver may recommend more frequent screening and Pap tests.  The human papillomavirus (HPV) test is an additional test that may be used for cervical cancer screening. The HPV test looks for the virus that can cause the cell changes on the cervix. The cells collected during the Pap test can be tested for HPV. The HPV test could be used to screen women aged 30 years and older, and should be used in women of any age who have unclear Pap test results. After the age of 30, women should have HPV testing at the same frequency as a Pap test.  Colorectal cancer can be detected and often prevented. Most routine colorectal cancer screening begins at the age of 50 and continues through age 75. However, your caregiver may recommend screening at an earlier age if you have risk factors for colon cancer. On a yearly basis, your caregiver may provide home test kits to check for hidden blood in the stool. Use of a small camera at the end of a tube, to directly examine the colon (sigmoidoscopy or colonoscopy), can detect the earliest forms of colorectal cancer. Talk to your caregiver about this at age 50, when  routine screening begins. Direct examination of the colon should be repeated every 5 to 10 years through age 75, unless early forms of pre-cancerous polyps or small growths are found.  Hepatitis C blood testing is recommended for all people born from 1945 through 1965 and any individual with known risks for hepatitis C.  Practice safe sex. Use condoms and avoid   high-risk sexual practices to reduce the spread of sexually transmitted infections (STIs). Sexually active women aged 25 and younger should be checked for Chlamydia, which is a common sexually transmitted infection. Older women with new or multiple partners should also be tested for Chlamydia. Testing for other STIs is recommended if you are sexually active and at increased risk.  Osteoporosis is a disease in which the bones lose minerals and strength with aging. This can result in serious bone fractures. The risk of osteoporosis can be identified using a bone density scan. Women ages 65 and over and women at risk for fractures or osteoporosis should discuss screening with their caregivers. Ask your caregiver whether you should be taking a calcium supplement or vitamin D to reduce the rate of osteoporosis.  Menopause can be associated with physical symptoms and risks. Hormone replacement therapy is available to decrease symptoms and risks. You should talk to your caregiver about whether hormone replacement therapy is right for you.  Use sunscreen. Apply sunscreen liberally and repeatedly throughout the day. You should seek shade when your shadow is shorter than you. Protect yourself by wearing long sleeves, pants, a wide-brimmed hat, and sunglasses year round, whenever you are outdoors.  Notify your caregiver of new moles or changes in moles, especially if there is a change in shape or color. Also notify your caregiver if a mole is larger than the size of a pencil eraser.  Stay current with your immunizations. Document Released: 12/04/2010  Document Revised: 09/15/2012 Document Reviewed: 12/04/2010 ExitCare Patient Information 2014 [employer].   

## 2014-12-15 NOTE — Telephone Encounter (Signed)
-----   Message from Dara Lordsimothy P Fontaine, MD sent at 12/15/2014 12:26 PM EDT ----- [facility] vaginal estradiol applicators 3 month supply twice-weekly refill 1 year

## 2014-12-15 NOTE — Telephone Encounter (Signed)
Rx called in 

## 2014-12-15 NOTE — Addendum Note (Signed)
Addended by: Dayna BarkerGARDNER, Caven Perine K on: 12/15/2014 12:40 PM   Modules accepted: Orders

## 2014-12-15 NOTE — Progress Notes (Signed)
Caroline Martin August 10, 1945 [id number]        69 y.o.  [id number] for breast and pelvic exam  Past medical history,surgical history, problem list, medications, allergies, family history and social history were all reviewed and documented as reviewed in the EPIC chart.  ROS:  Performed with pertinent positives and negatives included in the history, assessment and plan.   Additional significant findings :  none   Exam: [facility] personassistant Filed Vitals:   12/15/14 1208  BP: 124/84  Height: 5\' 4"  (1.626 m)  Weight: 207 lb (93.895 kg)   General appearance:  Normal affect, orientation and appearance. Skin: Grossly normal HEENT: Without gross lesions.  No cervical or supraclavicular adenopathy. Thyroid normal.  Lungs:  Clear without wheezing, rales or rhonchi Cardiac: RR, without RMG Abdominal:  Soft, nontender, without masses, guarding, rebound, organomegaly or hernia Breasts:  Examined lying and sitting without masses, retractions, discharge or axillary adenopathy. Pelvic:  Ext/BUS/vagina with atrophic changes  Cervix with atrophic changes  Uterus difficult to palpate but no gross masses or tenderness  Adnexa  Without gross masses or tenderness    Anus and perineum  Normal   Rectovaginal  Normal sphincter tone without palpated masses or tenderness.    Assessment/Plan:  69 y.o. [id number] female for breast and pelvic exam.   1. Postmenopausal/atrophic genital changes. Patient had been on formulated vaginal estrogen cream twice weekly but ran out. Has return of her vaginal dryness and she wants to reinitiate the vaginal estrogen cream.  We discussed the issues as far as absorption and possible systemic effects to include stroke heart attack DVT and breast cancer. Patient's comfortable with using and [facility] estradiol vaginal cream twice weekly 1 year. 2. Pap smear 2013. Pap smear done today. No history of abnormal Pap smears previously. Options to stop  screening altogether she is over the age of 67 versus less frequent screening intervals reviewed. Will readdress on an annual basis. 3. Mammography 2014. Patient knows she is overdue and agrees to call and schedule. SBE monthly reviewed. 4. Colonoscopy never. I again strongly recommended she schedule a screening colonoscopy. The and offensive early detection and precancerous polyp removal reviewed. Patient agrees to schedule an names and numbers provided. 5. DEXA 2015 normal. Plan repeat at 5 year interval. Increased calcium vitamin D reviewed. 6. Health maintenance. No routine blood work done as this is done at her primary physician's office. Follow up in one year, sooner as needed.   Dara LordsFONTAINE,Caroline Martin P MD, 12:28 PM 12/15/2014

## 2014-12-16 LAB — URINALYSIS W MICROSCOPIC + REFLEX CULTURE
BILIRUBIN URINE: NEGATIVE
CASTS: NONE SEEN
CRYSTALS: NONE SEEN
GLUCOSE, UA: NEGATIVE mg/dL
HGB URINE DIPSTICK: NEGATIVE
Ketones, ur: NEGATIVE mg/dL
LEUKOCYTES UA: NEGATIVE
NITRITE: NEGATIVE
PH: 7.5 (ref 5.0–8.0)
PROTEIN: NEGATIVE mg/dL
SPECIFIC GRAVITY, URINE: 1.016 (ref 1.005–1.030)
Urobilinogen, UA: 1 mg/dL (ref 0.0–1.0)

## 2014-12-17 LAB — URINE CULTURE: Colony Count: >=100000

## 2014-12-21 LAB — CYTOLOGY - PAP

## 2015-12-21 ENCOUNTER — Encounter: Payer: Medicare PPO | Admitting: Gynecology

## 2016-01-12 ENCOUNTER — Other Ambulatory Visit: Payer: Self-pay | Admitting: Endocrinology

## 2016-01-12 ENCOUNTER — Ambulatory Visit (INDEPENDENT_AMBULATORY_CARE_PROVIDER_SITE_OTHER): Payer: Medicare Other | Admitting: Gynecology

## 2016-01-12 ENCOUNTER — Other Ambulatory Visit: Payer: Self-pay | Admitting: Gynecology

## 2016-01-12 ENCOUNTER — Encounter: Payer: Self-pay | Admitting: Gynecology

## 2016-01-12 VITALS — BP 122/80 | Ht 64.0 in | Wt 203.0 lb

## 2016-01-12 DIAGNOSIS — Z01419 Encounter for gynecological examination (general) (routine) without abnormal findings: Secondary | ICD-10-CM | POA: Diagnosis not present

## 2016-01-12 DIAGNOSIS — Z9189 Other specified personal risk factors, not elsewhere classified: Secondary | ICD-10-CM

## 2016-01-12 DIAGNOSIS — N898 Other specified noninflammatory disorders of vagina: Secondary | ICD-10-CM

## 2016-01-12 DIAGNOSIS — Z1231 Encounter for screening mammogram for malignant neoplasm of breast: Secondary | ICD-10-CM

## 2016-01-12 DIAGNOSIS — N952 Postmenopausal atrophic vaginitis: Secondary | ICD-10-CM | POA: Diagnosis not present

## 2016-01-12 MED ORDER — ESTRADIOL 2 MG VA RING: 2.0000 mg | VAGINAL_RING | VAGINAL | 3 refills | Status: DC

## 2016-01-12 NOTE — Patient Instructions (Signed)
Schedule your colonoscopy with either:  [facility]   Address: [address], [place], [place] [address]  [phone]    or  [facility]  Address: [address], [place], [place] [address]  [phone]       Call to Schedule your mammogram  Facilities in [place]: 1)  [facility]. [facility], [address] Phone: [phone] 2)  Dr. Bertrand at [facility]  [address] Phone: [phone]     Mammogram A mammogram is an X-ray test to find changes in a woman's breast. You should get a mammogram if:  You are 40 years of age or older  You have risk factors.   Your doctor recommends that you have one.  BEFORE THE TEST  Do not schedule the test the week before your period, especially if your breasts are sore during this time.  On the day of your mammogram:  Wash your breasts and armpits well. After washing, do not put on any deodorant or talcum powder on until after your test.   Eat and drink as you usually do.   Take your medicines as usual.   If you are diabetic and take insulin, make sure you:   Eat before coming for your test.   Take your insulin as usual.   If you cannot keep your appointment, call before the appointment to cancel. Schedule another appointment.  TEST  You will need to undress from the waist up. You will put on a hospital gown.   Your breast will be put on the mammogram machine, and it will press firmly on your breast with a piece of plastic called a compression paddle. This will make your breast flatter so that the machine can X-ray all parts of your breast.   Both breasts will be X-rayed. Each breast will be X-rayed from above and from the side. An X-ray might need to be taken again if the picture is not good enough.   The mammogram will last about 15 to 30 minutes.  AFTER THE TEST Finding out the results of your test Ask when  your test results will be ready. Make sure you get your test results.  Document Released: 08/17/2008 Document Revised: 05/10/2011 Document Reviewed: 08/17/2008 ExitCare Patient Information 2012 [employer].   

## 2016-01-12 NOTE — Progress Notes (Signed)
    Caroline Martin Oct 30, 1945 [id number]        70 y.o.  [id number] for breast and pelvic exam. Several issues noted below.  Past medical history,surgical history, problem list, medications, allergies, family history and social history were all reviewed and documented as reviewed in the EPIC chart.  ROS:  Performed with pertinent positives and negatives included in the history, assessment and plan.   Additional significant findings :  None   Exam: Caroline Martin assistant Vitals:   01/12/16 1118  BP: 122/80  Weight: 203 lb (92.1 kg)  Height: 5\' 4"  (1.626 m)   Body mass index is 34.84 kg/m.  General appearance:  Normal affect, orientation and appearance. Skin: Grossly normal HEENT: Without gross lesions.  No cervical or supraclavicular adenopathy. Thyroid normal.  Lungs:  Clear without wheezing, rales or rhonchi Cardiac: RR, without RMG Abdominal:  Soft, nontender, without masses, guarding, rebound, organomegaly or hernia Breasts:  Examined lying and sitting without masses, retractions, discharge or axillary adenopathy.  Bilateral implants noted Pelvic:  Ext/BUS/Vagina with atrophic changes  Cervix with atrophic changes. Pap smear done  Uterus difficult to palpate but no gross masses or tenderness   Adnexa without gross masses or tenderness    Anus and perineum normal   Rectovaginal normal sphincter tone without palpated masses or tenderness.    Assessment/Plan:  70 y.o. [name] female for breast and pelvic exam.   1. Vaginal dryness. Patient has become sexually active and notes issues with vaginal dryness. Has previously used formulated vaginal estrogen cream but did not feel like it was that effective and has stopped it.  Is not having more global symptoms such as hot flushes night sweats. Options for treatment including OTC products versus vaginal estrogen such as Vagifem, vaginal cream, Estring and Osphena. The risks/benefits of each choice reviewed. Patient read about Estring is  interested trying this. I reviewed the issues of possible absorption with systemic effects such as thrombosis breast stimulation and endometrial stimulation. Patient understands and accepts. Estring 2 mg prescribed. Patient follow up if she has any issues placing or removing it. Need to replace every 3 months discussed. 2. 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 Reviewed with patient implications to include unknown effects with aging populations. Pap smear done today. Recommended continuing annual cytology based on this history. Does give a history of freezing of her cervix when she was 22. Normal Pap smears since 3. Mammography 2014. Patient understands she is overdue. Strongly recommended she schedule screening mammogram now particularly with DES history. Patient understands and acknowledges my recommendations. SBE monthly reviewed. 4. Colonoscopy never. I again strongly recommended she schedule a screening colonoscopy. Precancerous polyp removal and early cancer recognition benefits reviewed.  Second most common cancer in women stressed. Patient acknowledges my recommendation. 5. DEXA 2015 normal. Plan repeat at 5 year interval. 6. Health maintenance. No routine lab work done as patient does this elsewhere. Follow up 1 year, sooner if any issues with her Estring.  Greater than 10 minutes of my time in excess of her breast and pelvic exam was spent in direct face to face counseling and coordination of care in regards to her problems of vaginal dryness and DES exposure history.    Dara LordsFONTAINE,TIMOTHY P MD, 11:56 AM 01/12/2016

## 2016-01-12 NOTE — Addendum Note (Signed)
Addended by: Dayna BarkerGARDNER, Aden Youngman K on: 01/12/2016 12:50 PM   Modules accepted: Orders

## 2016-01-13 LAB — PAP IG W/ RFLX HPV ASCU

## 2016-01-23 ENCOUNTER — Ambulatory Visit: Payer: Medicare HMO

## 2016-02-10 ENCOUNTER — Ambulatory Visit: Payer: Medicare HMO

## 2016-02-17 ENCOUNTER — Ambulatory Visit
Admission: RE | Admit: 2016-02-17 | Discharge: 2016-02-17 | Disposition: A | Discharge status: Home / Self Care | Payer: Medicare Other | Source: Ambulatory Visit | Attending: Gynecology | Admitting: Gynecology

## 2016-02-17 DIAGNOSIS — Z1231 Encounter for screening mammogram for malignant neoplasm of breast: Secondary | ICD-10-CM

## 2016-02-23 ENCOUNTER — Other Ambulatory Visit: Payer: Self-pay | Admitting: Gynecology

## 2016-02-23 DIAGNOSIS — R928 Other abnormal and inconclusive findings on diagnostic imaging of breast: Secondary | ICD-10-CM

## 2016-03-06 ENCOUNTER — Ambulatory Visit
Admission: RE | Admit: 2016-03-06 | Discharge: 2016-03-06 | Disposition: A | Discharge status: Home / Self Care | Payer: Medicare Other | Source: Ambulatory Visit | Attending: Gynecology | Admitting: Gynecology

## 2016-03-06 DIAGNOSIS — R928 Other abnormal and inconclusive findings on diagnostic imaging of breast: Secondary | ICD-10-CM

## 2016-07-30 ENCOUNTER — Observation Stay (HOSPITAL_COMMUNITY): Payer: Medicare Other | Admitting: Anesthesiology

## 2016-07-30 ENCOUNTER — Emergency Department (HOSPITAL_COMMUNITY): Payer: Medicare Other

## 2016-07-30 ENCOUNTER — Encounter (HOSPITAL_COMMUNITY): Payer: Self-pay | Admitting: Emergency Medicine

## 2016-07-30 ENCOUNTER — Observation Stay (HOSPITAL_COMMUNITY)
Admission: EM | Admit: 2016-07-30 | Discharge: 2016-07-31 | Disposition: A | Discharge status: Home / Self Care | Payer: Medicare Other | Attending: General Surgery | Admitting: General Surgery

## 2016-07-30 ENCOUNTER — Observation Stay (HOSPITAL_COMMUNITY): Payer: Medicare Other

## 2016-07-30 ENCOUNTER — Ambulatory Visit: Admit: 2016-07-30 | Payer: Medicare Other | Admitting: General Surgery

## 2016-07-30 ENCOUNTER — Encounter (HOSPITAL_COMMUNITY)
Admission: EM | Disposition: A | Discharge status: Home / Self Care | Payer: Self-pay | Source: Home / Self Care | Attending: Emergency Medicine

## 2016-07-30 DIAGNOSIS — I10 Essential (primary) hypertension: Secondary | ICD-10-CM | POA: Diagnosis not present

## 2016-07-30 DIAGNOSIS — K219 Gastro-esophageal reflux disease without esophagitis: Secondary | ICD-10-CM | POA: Insufficient documentation

## 2016-07-30 DIAGNOSIS — L409 Psoriasis, unspecified: Secondary | ICD-10-CM | POA: Diagnosis not present

## 2016-07-30 DIAGNOSIS — K8000 Calculus of gallbladder with acute cholecystitis without obstruction: Secondary | ICD-10-CM | POA: Diagnosis present

## 2016-07-30 DIAGNOSIS — F329 Major depressive disorder, single episode, unspecified: Secondary | ICD-10-CM | POA: Diagnosis not present

## 2016-07-30 DIAGNOSIS — K8020 Calculus of gallbladder without cholecystitis without obstruction: Secondary | ICD-10-CM | POA: Diagnosis present

## 2016-07-30 DIAGNOSIS — Z419 Encounter for procedure for purposes other than remedying health state, unspecified: Secondary | ICD-10-CM

## 2016-07-30 DIAGNOSIS — M329 Systemic lupus erythematosus, unspecified: Secondary | ICD-10-CM | POA: Diagnosis not present

## 2016-07-30 DIAGNOSIS — K819 Cholecystitis, unspecified: Secondary | ICD-10-CM

## 2016-07-30 DIAGNOSIS — R109 Unspecified abdominal pain: Secondary | ICD-10-CM

## 2016-07-30 DIAGNOSIS — K8012 Calculus of gallbladder with acute and chronic cholecystitis without obstruction: Principal | ICD-10-CM | POA: Insufficient documentation

## 2016-07-30 DIAGNOSIS — Z79899 Other long term (current) drug therapy: Secondary | ICD-10-CM | POA: Insufficient documentation

## 2016-07-30 DIAGNOSIS — E785 Hyperlipidemia, unspecified: Secondary | ICD-10-CM | POA: Insufficient documentation

## 2016-07-30 HISTORY — PX: CHOLECYSTECTOMY: SHX55

## 2016-07-30 LAB — BASIC METABOLIC PANEL
Anion gap: 10 (ref 5–15)
BUN: 12 mg/dL (ref 6–20)
CALCIUM: 8.9 mg/dL (ref 8.9–10.3)
CO2: 27 mmol/L (ref 22–32)
CREATININE: 0.73 mg/dL (ref 0.44–1.00)
Chloride: 92 mmol/L — ABNORMAL LOW (ref 101–111)
GFR calc Af Amer: >60 mL/min (ref >60)
GLUCOSE: 128 mg/dL — AB (ref 65–99)
POTASSIUM: 3.5 mmol/L (ref 3.5–5.1)
SODIUM: 129 mmol/L — AB (ref 135–145)

## 2016-07-30 LAB — HEPATIC FUNCTION PANEL
ALT: 26 U/L (ref 14–54)
AST: 30 U/L (ref 15–41)
Albumin: 4.3 g/dL (ref 3.5–5.0)
Alkaline Phosphatase: 73 U/L (ref 38–126)
BILIRUBIN DIRECT: 0.3 mg/dL (ref 0.1–0.5)
BILIRUBIN INDIRECT: 1.5 mg/dL — AB (ref 0.3–0.9)
BILIRUBIN TOTAL: 1.8 mg/dL — AB (ref 0.3–1.2)
Total Protein: 7.4 g/dL (ref 6.5–8.1)

## 2016-07-30 LAB — URINALYSIS, ROUTINE W REFLEX MICROSCOPIC
BILIRUBIN URINE: NEGATIVE
GLUCOSE, UA: NEGATIVE mg/dL
HGB URINE DIPSTICK: NEGATIVE
KETONES UR: 20 mg/dL — AB
Leukocytes, UA: NEGATIVE
NITRITE: NEGATIVE
PH: 7 (ref 5.0–8.0)
Protein, ur: NEGATIVE mg/dL
SPECIFIC GRAVITY, URINE: 1.018 (ref 1.005–1.030)

## 2016-07-30 LAB — CBC
HEMATOCRIT: 41.7 % (ref 36.0–46.0)
Hemoglobin: 14.5 g/dL (ref 12.0–15.0)
MCH: 29.1 pg (ref 26.0–34.0)
MCHC: 34.8 g/dL (ref 30.0–36.0)
MCV: 83.6 fL (ref 78.0–100.0)
PLATELETS: 242 10*3/uL (ref 150–400)
RBC: 4.99 MIL/uL (ref 3.87–5.11)
RDW: 13.1 % (ref 11.5–15.5)
WBC: 14.2 10*3/uL — ABNORMAL HIGH (ref 4.0–10.5)

## 2016-07-30 LAB — I-STAT TROPONIN, ED: Troponin i, poc: 0 ng/mL (ref 0.00–0.08)

## 2016-07-30 LAB — SURGICAL PCR SCREEN
MRSA, PCR: NEGATIVE
Staphylococcus aureus: NEGATIVE

## 2016-07-30 LAB — LIPASE, BLOOD: Lipase: 16 U/L (ref 11–51)

## 2016-07-30 LAB — CBG MONITORING, ED: GLUCOSE-CAPILLARY: 115 mg/dL — AB (ref 65–99)

## 2016-07-30 SURGERY — LAPAROSCOPIC CHOLECYSTECTOMY WITH INTRAOPERATIVE CHOLANGIOGRAM
Anesthesia: General

## 2016-07-30 MED ORDER — RAMIPRIL 10 MG PO CAPS
10.0000 mg | ORAL_CAPSULE | Freq: Every day | ORAL | Status: DC
  Filled 2016-07-30: qty 1
  Filled 2016-07-30: qty 2

## 2016-07-30 MED ORDER — BUPIVACAINE HCL (PF) 0.25 % IJ SOLN
INTRAMUSCULAR | Status: DC | PRN
  Administered 2016-07-30: 20 mL

## 2016-07-30 MED ORDER — SUCCINYLCHOLINE CHLORIDE 200 MG/10ML IV SOSY
PREFILLED_SYRINGE | INTRAVENOUS | Status: DC | PRN
  Administered 2016-07-30: 100 mg via INTRAVENOUS

## 2016-07-30 MED ORDER — MORPHINE SULFATE (PF) 4 MG/ML IV SOLN
4.0000 mg | Freq: Once | INTRAVENOUS | Status: AC
  Administered 2016-07-30: 4 mg via INTRAVENOUS
  Filled 2016-07-30: qty 1

## 2016-07-30 MED ORDER — IOPAMIDOL (ISOVUE-300) INJECTION 61%
INTRAVENOUS | Status: AC
  Filled 2016-07-30: qty 50

## 2016-07-30 MED ORDER — SODIUM CHLORIDE 0.9 % IV SOLN
Freq: Once | INTRAVENOUS | Status: AC
  Administered 2016-07-30 (×2): via INTRAVENOUS

## 2016-07-30 MED ORDER — 0.9 % SODIUM CHLORIDE (POUR BTL) OPTIME
TOPICAL | Status: DC | PRN
  Administered 2016-07-30: 1000 mL

## 2016-07-30 MED ORDER — OXYCODONE HCL 5 MG PO TABS
10.0000 mg | ORAL_TABLET | ORAL | Status: DC | PRN
  Administered 2016-07-31: 10 mg via ORAL
  Filled 2016-07-30: qty 2

## 2016-07-30 MED ORDER — LIDOCAINE 2% (20 MG/ML) 5 ML SYRINGE
INTRAMUSCULAR | Status: DC | PRN
  Administered 2016-07-30: 60 mg via INTRAVENOUS

## 2016-07-30 MED ORDER — HYDROMORPHONE HCL 2 MG/ML IJ SOLN
INTRAMUSCULAR | Status: AC
  Filled 2016-07-30: qty 1

## 2016-07-30 MED ORDER — SUGAMMADEX SODIUM 200 MG/2ML IV SOLN
INTRAVENOUS | Status: DC | PRN
  Administered 2016-07-30: 200 mg via INTRAVENOUS

## 2016-07-30 MED ORDER — FENTANYL CITRATE (PF) 100 MCG/2ML IJ SOLN
INTRAMUSCULAR | Status: DC | PRN
  Administered 2016-07-30 (×2): 50 ug via INTRAVENOUS

## 2016-07-30 MED ORDER — KCL IN DEXTROSE-NACL 40-5-0.9 MEQ/L-%-% IV SOLN
INTRAVENOUS | Status: DC
  Filled 2016-07-30: qty 1000

## 2016-07-30 MED ORDER — DULOXETINE HCL 60 MG PO CPEP
60.0000 mg | ORAL_CAPSULE | Freq: Every day | ORAL | Status: DC
  Administered 2016-07-30 – 2016-07-31 (×2): 60 mg via ORAL
  Filled 2016-07-30 (×2): qty 1

## 2016-07-30 MED ORDER — DIPHENHYDRAMINE HCL 50 MG/ML IJ SOLN: 12.5000 mg | Freq: Four times a day (QID) | INTRAMUSCULAR | Status: DC | PRN

## 2016-07-30 MED ORDER — ONDANSETRON HCL 4 MG/2ML IJ SOLN
4.0000 mg | Freq: Once | INTRAMUSCULAR | Status: AC
  Administered 2016-07-30: 4 mg via INTRAVENOUS
  Filled 2016-07-30: qty 2

## 2016-07-30 MED ORDER — HYDROMORPHONE HCL 1 MG/ML IJ SOLN
0.2500 mg | INTRAMUSCULAR | Status: DC | PRN
  Administered 2016-07-30: 0.25 mg via INTRAVENOUS
  Administered 2016-07-30 (×2): 0.5 mg via INTRAVENOUS
  Administered 2016-07-30 (×2): 0.25 mg via INTRAVENOUS

## 2016-07-30 MED ORDER — BUPIVACAINE HCL (PF) 0.25 % IJ SOLN
INTRAMUSCULAR | Status: AC
  Filled 2016-07-30: qty 30

## 2016-07-30 MED ORDER — MORPHINE SULFATE (PF) 4 MG/ML IV SOLN
1.0000 mg | INTRAVENOUS | Status: DC | PRN
  Administered 2016-07-30 – 2016-07-31 (×3): 2 mg via INTRAVENOUS
  Filled 2016-07-30 (×3): qty 1

## 2016-07-30 MED ORDER — PROPOFOL 10 MG/ML IV BOLUS
INTRAVENOUS | Status: DC | PRN
  Administered 2016-07-30: 150 mg via INTRAVENOUS

## 2016-07-30 MED ORDER — ROCURONIUM BROMIDE 50 MG/5ML IV SOSY
PREFILLED_SYRINGE | INTRAVENOUS | Status: DC | PRN
  Administered 2016-07-30: 10 mg via INTRAVENOUS
  Administered 2016-07-30: 40 mg via INTRAVENOUS

## 2016-07-30 MED ORDER — LIDOCAINE 2% (20 MG/ML) 5 ML SYRINGE
INTRAMUSCULAR | Status: AC
  Filled 2016-07-30: qty 5

## 2016-07-30 MED ORDER — LACTATED RINGERS IR SOLN
Status: DC | PRN
  Administered 2016-07-30: 1000 mL

## 2016-07-30 MED ORDER — ONDANSETRON HCL 4 MG/2ML IJ SOLN
INTRAMUSCULAR | Status: AC
  Filled 2016-07-30: qty 2

## 2016-07-30 MED ORDER — FENTANYL CITRATE (PF) 100 MCG/2ML IJ SOLN
INTRAMUSCULAR | Status: AC
  Filled 2016-07-30: qty 2

## 2016-07-30 MED ORDER — ROCURONIUM BROMIDE 50 MG/5ML IV SOSY
PREFILLED_SYRINGE | INTRAVENOUS | Status: AC
  Filled 2016-07-30: qty 5

## 2016-07-30 MED ORDER — PANTOPRAZOLE SODIUM 40 MG PO TBEC
40.0000 mg | DELAYED_RELEASE_TABLET | Freq: Every day | ORAL | Status: DC
  Administered 2016-07-30 – 2016-07-31 (×2): 40 mg via ORAL
  Filled 2016-07-30 (×2): qty 1

## 2016-07-30 MED ORDER — HYDROXYCHLOROQUINE SULFATE 200 MG PO TABS
200.0000 mg | ORAL_TABLET | Freq: Two times a day (BID) | ORAL | Status: DC
  Administered 2016-07-31: 200 mg via ORAL
  Filled 2016-07-30 (×2): qty 1

## 2016-07-30 MED ORDER — DIPHENHYDRAMINE HCL 12.5 MG/5ML PO ELIX: 12.5000 mg | ORAL_SOLUTION | Freq: Four times a day (QID) | ORAL | Status: DC | PRN

## 2016-07-30 MED ORDER — SUGAMMADEX SODIUM 200 MG/2ML IV SOLN
INTRAVENOUS | Status: AC
  Filled 2016-07-30: qty 2

## 2016-07-30 MED ORDER — DEXAMETHASONE SODIUM PHOSPHATE 10 MG/ML IJ SOLN
INTRAMUSCULAR | Status: AC
  Filled 2016-07-30: qty 1

## 2016-07-30 MED ORDER — PROPOFOL 10 MG/ML IV BOLUS
INTRAVENOUS | Status: AC
  Filled 2016-07-30: qty 20

## 2016-07-30 MED ORDER — KCL IN DEXTROSE-NACL 40-5-0.9 MEQ/L-%-% IV SOLN
INTRAVENOUS | Status: DC
  Administered 2016-07-30: 18:00:00 via INTRAVENOUS
  Filled 2016-07-30 (×2): qty 1000

## 2016-07-30 MED ORDER — SUCCINYLCHOLINE CHLORIDE 200 MG/10ML IV SOSY
PREFILLED_SYRINGE | INTRAVENOUS | Status: AC
  Filled 2016-07-30: qty 10

## 2016-07-30 MED ORDER — SODIUM CHLORIDE 0.9 % IV SOLN
INTRAVENOUS | Status: DC | PRN
  Administered 2016-07-30: 14:00:00 via INTRAVENOUS

## 2016-07-30 MED ORDER — IOPAMIDOL (ISOVUE-300) INJECTION 61%
INTRAVENOUS | Status: DC | PRN
  Administered 2016-07-30: 5 mL

## 2016-07-30 MED ORDER — HYDROCHLOROTHIAZIDE 25 MG PO TABS
25.0000 mg | ORAL_TABLET | Freq: Every day | ORAL | Status: DC
Start: 2016-07-30 — End: 2016-07-31
  Administered 2016-07-30 – 2016-07-31 (×2): 25 mg via ORAL
  Filled 2016-07-30 (×2): qty 1

## 2016-07-30 MED ORDER — CEFTRIAXONE SODIUM 2 G IJ SOLR
2.0000 g | INTRAMUSCULAR | Status: DC
  Administered 2016-07-30: 2 g via INTRAVENOUS
  Filled 2016-07-30 (×2): qty 2

## 2016-07-30 MED ORDER — ONDANSETRON HCL 4 MG/2ML IJ SOLN: 4.0000 mg | Freq: Four times a day (QID) | INTRAMUSCULAR | Status: DC | PRN

## 2016-07-30 MED ORDER — EZETIMIBE-SIMVASTATIN 10-40 MG PO TABS
1.0000 | ORAL_TABLET | Freq: Every day | ORAL | Status: DC
  Filled 2016-07-30: qty 1

## 2016-07-30 MED ORDER — ONDANSETRON 4 MG PO TBDP: 4.0000 mg | ORAL_TABLET | Freq: Four times a day (QID) | ORAL | Status: DC | PRN

## 2016-07-30 SURGICAL SUPPLY — 38 items
ADH SKN CLS APL DERMABOND .7 (GAUZE/BANDAGES/DRESSINGS) ×1
APPLIER CLIP ROT 10 11.4 M/L (STAPLE) ×3
APR CLP MED LRG 11.4X10 (STAPLE) ×1
BAG SPEC RTRVL 10 TROC 200 (ENDOMECHANICALS) ×1
BAG SPEC RTRVL LRG 6X4 10 (ENDOMECHANICALS)
CABLE HIGH FREQUENCY MONO STRZ (ELECTRODE) ×3 IMPLANT
CATH REDDICK CHOLANGI 4FR 50CM (CATHETERS) IMPLANT
CHLORAPREP W/TINT 26ML (MISCELLANEOUS) ×3 IMPLANT
CLIP APPLIE ROT 10 11.4 M/L (STAPLE) ×1 IMPLANT
COVER MAYO STAND STRL (DRAPES) ×3 IMPLANT
COVER SURGICAL LIGHT HANDLE (MISCELLANEOUS) IMPLANT
DECANTER SPIKE VIAL GLASS SM (MISCELLANEOUS) ×3 IMPLANT
DERMABOND ADVANCED (GAUZE/BANDAGES/DRESSINGS) ×2
DERMABOND ADVANCED .7 DNX12 (GAUZE/BANDAGES/DRESSINGS) ×1 IMPLANT
DRAPE C-ARM 42X120 X-RAY (DRAPES) ×3 IMPLANT
ELECT REM PT RETURN 9FT ADLT (ELECTROSURGICAL) ×3
ELECTRODE REM PT RTRN 9FT ADLT (ELECTROSURGICAL) ×1 IMPLANT
GLOVE BIOGEL PI IND STRL 7.5 (GLOVE) ×1 IMPLANT
GLOVE BIOGEL PI INDICATOR 7.5 (GLOVE) ×2
GLOVE ECLIPSE 7.5 STRL STRAW (GLOVE) ×3 IMPLANT
GOWN STRL REUS W/TWL XL LVL3 (GOWN DISPOSABLE) ×9 IMPLANT
HEMOSTAT SNOW SURGICEL 2X4 (HEMOSTASIS) IMPLANT
HEMOSTAT SURGICEL 4X8 (HEMOSTASIS) IMPLANT
KIT BASIN OR (CUSTOM PROCEDURE TRAY) ×3 IMPLANT
POUCH RETRIEVAL ECOSAC 10 (ENDOMECHANICALS) IMPLANT
POUCH RETRIEVAL ECOSAC 10MM (ENDOMECHANICALS) ×2
POUCH SPECIMEN RETRIEVAL 10MM (ENDOMECHANICALS) IMPLANT
SCISSORS LAP 5X35 DISP (ENDOMECHANICALS) ×3 IMPLANT
SET CHOLANGIOGRAPH MIX (MISCELLANEOUS) ×3 IMPLANT
SET IRRIG TUBING LAPAROSCOPIC (IRRIGATION / IRRIGATOR) ×2 IMPLANT
SLEEVE XCEL OPT CAN 5 100 (ENDOMECHANICALS) ×3 IMPLANT
SUT MNCRL AB 4-0 PS2 18 (SUTURE) ×3 IMPLANT
TOWEL OR 17X26 10 PK STRL BLUE (TOWEL DISPOSABLE) ×3 IMPLANT
TRAY LAPAROSCOPIC (CUSTOM PROCEDURE TRAY) ×3 IMPLANT
TROCAR BLADELESS OPT 5 100 (ENDOMECHANICALS) ×3 IMPLANT
TROCAR XCEL BLUNT TIP 100MML (ENDOMECHANICALS) ×3 IMPLANT
TROCAR XCEL NON-BLD 11X100MML (ENDOMECHANICALS) ×3 IMPLANT
TUBING INSUF HEATED (TUBING) ×3 IMPLANT

## 2016-07-30 NOTE — H&P (Signed)
Caroline Martin is an 71 y.o. female.   Chief Complaint: CP mid epigastric area without SOB, RUQ pain. HPI: From presented to the ED early this morning with complaints of chest pain. Pain developed in her back in her right upper thorax yesterday shooting pains in the right upper quadrant pain is worse lying flat.  She has not had this problem before. She does have occasional GERD symptoms after eating fatty foods. Workup shows she is afebrile and vital signs are stable. Blood pressure is up some. Labs shows sodium of 129 potassium of 3.5. Total bilirubin is up to 1.8 indirect is 1.5. AST,ALT, and alkaline phosphatase are normal. WBC is 14.2. Urinalysis is normal. EKG shows an incomplete right bundle branch block, no acute findings. Chest x-ray is normal. Abdominal ultrasound shows: 2.2 cm gallstone seen in neck of gallbladder without gallbladder wall thickening or pericholecystic fluid. Proximal common bile duct is mildly dilated at 7 mm. Correlation with liver function tests is recommended to rule out distal common bile duct obstruction. Distal common bile duct is not well visualized due to overlying bowel gas. Hepatic parenchyma suggestive of fatty infiltration.  We are asked to see.  Past Medical History:  Diagnosis Date  . Acid reflux occasional  . Depression   . DES exposure in utero   . Hyperlipidemia   . Hypertension   . 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 SUI (stress urinary incontinence, female)     Past Surgical History:  Procedure Laterality Date  . AUGMENTATION MAMMAPLASTY    . CATARACT EXTRACTION    . CYSTOSCOPY  09/20/2011   Procedure: CYSTOSCOPY;  Surgeon: Reece Packer, MD;  Location: [facility];  Service: Urology;  Laterality: N/A;  Monarc sling  . PUBOVAGINAL SLING  2002  . PUBOVAGINAL SLING  09/20/2011   Procedure: Gaynelle Arabian;  Surgeon: Reece Packer, MD;  Location: [facility];  Service: Urology;  Laterality:  N/A;    Family History  Problem Relation Age of Onset  . Breast cancer Mother 16  . Heart attack Father   . Diabetes Son    Social History:  reports that she has never smoked. She has never used smokeless tobacco. She reports that she drinks about 2.4 oz of alcohol per week . She reports that she does not use drugs.  Allergies: No Known Allergies  Prior to Admission medications   Medication Sig Start Date End Date Taking? Authorizing Provider  B Complex Vitamins (VITAMIN-B COMPLEX PO) Take 1 capsule by mouth daily.   Yes Historical Provider, MD  cholecalciferol (VITAMIN D) 400 units TABS tablet Take 400 Units by mouth daily.   Yes Historical Provider, MD  cimetidine (TAGAMET) 300 MG tablet Take 300 mg by mouth 2 (two) times daily.    Yes Historical Provider, MD  DULoxetine (CYMBALTA) 60 MG capsule Take 60 mg by mouth daily.   Yes Historical Provider, MD  ezetimibe-simvastatin (VYTORIN) 10-40 MG per tablet Take 1 tablet by mouth at bedtime.   Yes Historical Provider, MD  hydrochlorothiazide (HYDRODIURIL) 25 MG tablet Take 25 mg by mouth daily.   Yes Historical Provider, MD  hydroxychloroquine (PLAQUENIL) 200 MG tablet Take 200 mg by mouth 2 (two) times daily. As directed 06/27/16  Yes Historical Provider, MD  ibuprofen (ADVIL,MOTRIN) 200 MG tablet Take 400 mg by mouth every 6 (six) hours as needed for mild pain or moderate pain.   Yes Historical Provider, MD  Multiple Vitamin (MULTIVITAMIN) tablet  Take 1 tablet by mouth daily.   Yes Historical Provider, MD  ramipril (ALTACE) 10 MG tablet Take 10 mg by mouth daily.   Yes Historical Provider, MD  estradiol (ESTRING) 2 MG vaginal ring Place 2 mg vaginally every 3 (three) months. follow package directions Patient not taking: Reported on 07/30/2016 01/12/16   Anastasio Auerbach, MD  NONFORMULARY OR COMPOUNDED ITEM estradiol 0.02% cream apply vaginally twice weekly Patient not taking: Reported on 01/12/2016 12/15/14   Anastasio Auerbach, MD      Results for orders placed or performed during the hospital encounter of 07/30/16 (from the past 48 hour(s))  Basic metabolic panel     Status: Abnormal   Collection Time: 07/30/16  8:11 AM  Result Value Ref Range   Sodium 129 (L) 135 - 145 mmol/L   Potassium 3.5 3.5 - 5.1 mmol/L   Chloride 92 (L) 101 - 111 mmol/L   CO2 27 22 - 32 mmol/L   Glucose, Bld 128 (H) 65 - 99 mg/dL   BUN 12 6 - 20 mg/dL   Creatinine, Ser 0.73 0.44 - 1.00 mg/dL   Calcium 8.9 8.9 - 10.3 mg/dL   GFR calc non Af Amer >60 >60 mL/min   GFR calc Af Amer >60 >60 mL/min    Comment: (NOTE) The eGFR has been calculated using the CKD EPI equation. This calculation has not been validated in all clinical situations. eGFR's persistently <60 mL/min signify possible Chronic Kidney Disease.    Anion gap 10 5 - 15  CBC     Status: Abnormal   Collection Time: 07/30/16  8:11 AM  Result Value Ref Range   WBC 14.2 (H) 4.0 - 10.5 K/uL   RBC 4.99 3.87 - 5.11 MIL/uL   Hemoglobin 14.5 12.0 - 15.0 g/dL   HCT 41.7 36.0 - 46.0 %   MCV 83.6 78.0 - 100.0 fL   MCH 29.1 26.0 - 34.0 pg   MCHC 34.8 30.0 - 36.0 g/dL   RDW 13.1 11.5 - 15.5 %   Platelets 242 150 - 400 K/uL  Hepatic function panel     Status: Abnormal   Collection Time: 07/30/16  8:11 AM  Result Value Ref Range   Total Protein 7.4 6.5 - 8.1 g/dL   Albumin 4.3 3.5 - 5.0 g/dL   AST 30 15 - 41 U/L   ALT 26 14 - 54 U/L   Alkaline Phosphatase 73 38 - 126 U/L   Total Bilirubin 1.8 (H) 0.3 - 1.2 mg/dL   Bilirubin, Direct 0.3 0.1 - 0.5 mg/dL   Indirect Bilirubin 1.5 (H) 0.3 - 0.9 mg/dL  Lipase, blood     Status: None   Collection Time: 07/30/16  8:11 AM  Result Value Ref Range   Lipase 16 11 - 51 U/L  I-stat troponin, ED     Status: None   Collection Time: 07/30/16  8:20 AM  Result Value Ref Range   Troponin i, poc 0.00 0.00 - 0.08 ng/mL   Comment 3            Comment: Due to the release kinetics of cTnI, a negative result within the first hours of the onset  of symptoms does not rule out myocardial infarction with certainty. If myocardial infarction is still suspected, repeat the test at appropriate intervals.   Urinalysis, Routine w reflex microscopic     Status: Abnormal   Collection Time: 07/30/16  9:12 AM  Result Value Ref Range   Color, Urine YELLOW  YELLOW   APPearance CLEAR CLEAR   Specific Gravity, Urine 1.018 1.005 - 1.030   pH 7.0 5.0 - 8.0   Glucose, UA NEGATIVE NEGATIVE mg/dL   Hgb urine dipstick NEGATIVE NEGATIVE   Bilirubin Urine NEGATIVE NEGATIVE   Ketones, ur 20 (A) NEGATIVE mg/dL   Protein, ur NEGATIVE NEGATIVE mg/dL   Nitrite NEGATIVE NEGATIVE   Leukocytes, UA NEGATIVE NEGATIVE   Dg Chest 2 View  Result Date: 07/30/2016 CLINICAL DATA:  Chest pressure. EXAM: CHEST  2 VIEW COMPARISON:  No recent prior. FINDINGS: Mediastinum and hilar structures are normal. Lungs are clear. No pleural effusion or pneumothorax. Heart size normal. Degenerative changes thoracic spine. IMPRESSION: No acute cardiopulmonary disease. Electronically Signed   By: Marcello Moores  Register   On: 07/30/2016 08:39   US Abdomen Limited Ruq  Result Date: 07/30/2016 CLINICAL DATA:  Acute right upper quadrant abdominal pain. EXAM: US ABDOMEN LIMITED - RIGHT UPPER QUADRANT COMPARISON:  None. FINDINGS: Gallbladder: 2.2 cm gallstone is seen in neck of gallbladder. No gallbladder wall thickening or pericholecystic fluid is noted. No sonographic Murphy's sign is noted. Common bile duct: Diameter: 7 mm which is mildly dilated. Distal portion is not well visualized due to overlying bowel gas. Liver: No focal lesion identified. Increased echogenicity is noted suggesting fatty infiltration. IMPRESSION: 2.2 cm gallstone seen in neck of gallbladder without gallbladder wall thickening or pericholecystic fluid. Proximal common bile duct is mildly dilated at 7 mm. Correlation with liver function tests is recommended to rule out distal common bile duct obstruction. Distal common bile  duct is not well visualized due to overlying bowel gas. Increased echogenicity of hepatic parenchyma is noted suggesting fatty infiltration. Electronically Signed   By: Marijo Conception, M.D.   On: 07/30/2016 09:13    Review of Systems  Constitutional: Positive for chills. Negative for diaphoresis, fever, malaise/fatigue and weight loss.  HENT: Negative.   Eyes: Negative.   Respiratory: Negative.   Cardiovascular: Negative.   Gastrointestinal: Positive for abdominal pain, heartburn, nausea and vomiting. Negative for blood in stool, constipation, diarrhea and melena.  Genitourinary: Positive for urgency.  Musculoskeletal: Negative.   Skin: Negative.   Neurological: Negative.  Negative for weakness.  Endo/Heme/Allergies: Negative.   Psychiatric/Behavioral: Negative.     Blood pressure 162/99, pulse 82, temperature 98.2 F (36.8 C), temperature source Oral, resp. rate 17, SpO2 94 %. Physical Exam  Constitutional: She is oriented to person, place, and time. She appears well-developed and well-nourished.  HENT:  Head: Normocephalic.  Mouth/Throat: No oropharyngeal exudate.  Eyes: Right eye exhibits no discharge. Left eye exhibits discharge. No scleral icterus.  Neck: Normal range of motion. Neck supple. No JVD present. No tracheal deviation present. No thyromegaly present.  Cardiovascular: Normal rate, regular rhythm, normal heart sounds and intact distal pulses.   No murmur heard. Respiratory: Effort normal and breath sounds normal. No respiratory distress. She has no wheezes. She has no rales. She exhibits no tenderness.  GI: Soft. Bowel sounds are normal. She exhibits no distension and no mass. There is tenderness (Tender in the right upper quadrant.). There is no rebound and no guarding.  Musculoskeletal: She exhibits no edema or tenderness.  Lymphadenopathy:    She has no cervical adenopathy.  Neurological: She is alert and oriented to person, place, and time. No cranial nerve  deficit.  Skin: Skin is warm and dry. No rash noted. She is not diaphoretic. No erythema. No pallor.  Psychiatric: She has a normal mood and affect. Her behavior is  normal. Judgment and thought content normal.     Assessment/Plan Acute: Cystitis/cholelithiasis. Hypertension Lupus/psoriasis - on Plaquenil History of anxiety/depression GERD  Plan: Patient has acute cholecystitis with cholelithiasis and a large 2 cm stone in the neck of her gallbladder. Plan surgery later this a.m. Patient in a long discussion and the risk and benefits were discussed with her in detail by Dr. Excell Seltzer. Dr. Excell Seltzer is seen and examined the patient.     Man Effertz, PA-C 07/30/2016, 11:48 AM

## 2016-07-30 NOTE — ED Provider Notes (Signed)
[facility]-EMERGENCY DEPT Provider Note   CSN: [id number] Arrival date & time: 07/30/16  0751     History   Chief Complaint Chief Complaint  Patient presents with  . Cholelithiasis  . Chest Pain    HPI Caroline Martin is a 71 y.o. female.  HPI Patient developed back pain in the right upper thoracic back yesterday. She reports over the course the day she started having some shooting pains in her right upper quadrant of the abdomen. It's worse with lying flat. No cough no fever. No shortness of breath. No lower extremity pain or swelling. Patient reports a really long time ago she was told she had a gallstone but if it didn't bother her not to pursue treatment at that time. Past Medical History:  Diagnosis Date  . Acid reflux occasional  . Depression   . DES exposure in utero   . Hyperlipidemia   . Hypertension   . 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 SUI (stress urinary incontinence, female)     Patient Active Problem List   Diagnosis Date Noted  . Cholelithiasis and acute cholecystitis without obstruction 07/30/2016    Past Surgical History:  Procedure Laterality Date  . AUGMENTATION MAMMAPLASTY    . CATARACT EXTRACTION    . CHOLECYSTECTOMY N/A 07/30/2016   Procedure: LAPAROSCOPIC CHOLECYSTECTOMY WITH INTRAOPERATIVE CHOLANGIOGRAM;  Surgeon: Glenna FellowsBenjamin Hoxworth, MD;  Location: [facility];  Service: General;  Laterality: N/A;  . CYSTOSCOPY  09/20/2011   Procedure: CYSTOSCOPY;  Surgeon: Martina SinnerScott A MacDiarmid, MD;  [facility];  Service: Urology;  Laterality: N/A;  Monarc sling  . PUBOVAGINAL SLING  2002  . PUBOVAGINAL SLING  09/20/2011   Procedure: Leonides GrillsPUBO-VAGINAL SLING;  Surgeon: Martina SinnerScott A MacDiarmid, MD;  [facility] [place];  Service: Urology;  Laterality: N/A;    OB History    Gravida Para Term Preterm AB Living   3 2     1 2    SAB TAB Ectopic Multiple Live Births   1               Home Medications    Prior to Admission medications   Medication Sig  Start Date End Date Taking? Authorizing Provider  B Complex Vitamins (VITAMIN-B COMPLEX PO) Take 1 capsule by mouth daily.   Yes Historical Provider, MD  cholecalciferol (VITAMIN D) 400 units TABS tablet Take 400 Units by mouth daily.   Yes Historical Provider, MD  cimetidine (TAGAMET) 300 MG tablet Take 300 mg by mouth 2 (two) times daily.    Yes Historical Provider, MD  DULoxetine (CYMBALTA) 60 MG capsule Take 60 mg by mouth daily.   Yes Historical Provider, MD  ezetimibe-simvastatin (VYTORIN) 10-40 MG per tablet Take 1 tablet by mouth at bedtime.   Yes Historical Provider, MD  hydrochlorothiazide (HYDRODIURIL) 25 MG tablet Take 25 mg by mouth daily.   Yes Historical Provider, MD  hydroxychloroquine (PLAQUENIL) 200 MG tablet Take 200 mg by mouth 2 (two) times daily. As directed 06/27/16  Yes Historical Provider, MD  Multiple Vitamin (MULTIVITAMIN) tablet Take 1 tablet by mouth daily.   Yes Historical Provider, MD  ramipril (ALTACE) 10 MG tablet Take 10 mg by mouth daily.   Yes Historical Provider, MD  acetaminophen (TYLENOL) 325 MG tablet You can take 2 tablets every 4 hours as needed for pain. This pain medication is in your prescribed medicine. You cannot take more than 4000 mg of acetaminophen per day. 07/31/16   Sherrie GeorgeWillard Jennings, PA-C  estradiol [facility](ESTRING) 2  MG vaginal ring Place 2 mg vaginally every 3 (three) months. follow package directions Patient not taking: Reported on 07/30/2016 01/12/16   Dara Lords, MD  ibuprofen (ADVIL,MOTRIN) 200 MG tablet You take 2-3 tablets every 6 hours as needed for pain. I would use this first and use the prescribed medication second. You can also alternate this with plain Tylenol for pain relief. 07/31/16   Sherrie George, PA-C  NONFORMULARY OR COMPOUNDED ITEM estradiol 0.02% cream apply vaginally twice weekly Patient not taking: Reported on 01/12/2016 12/15/14   Dara Lords, MD  oxyCODONE (OXY IR/ROXICODONE) 5 MG immediate release tablet Take 1  tablet (5 mg total) by mouth every 4 (four) hours as needed for moderate pain. 07/31/16   Jerre Simon, PA    Family History Family History  Problem Relation Age of Onset  . Breast cancer Mother 73  . Heart attack Father   . Diabetes Son     Social History Social History  Substance Use Topics  . Smoking status: Never Smoker  . Smokeless tobacco: Never Used  . Alcohol use 2.4 oz/week    4 Standard drinks or equivalent per week     Comment: Occas.     Allergies   Patient has no known allergies.   Review of Systems Review of Systems 10 Systems reviewed and are negative for acute change except as noted in the HPI.   Physical Exam Updated Vital Signs BP (!) 107/91 (BP Location: Right Arm)   Pulse 92   Temp 99.5 F (37.5 C) (Oral)   Resp 18   Ht 5' 4.5" (1.638 m)   Wt 208 lb (94.3 kg)   SpO2 (!) 85%   BMI 35.15 kg/m   Physical Exam  Constitutional: She is oriented to person, place, and time. She appears well-developed and well-nourished. No distress.  HENT:  Head: Normocephalic and atraumatic.  Mouth/Throat: Oropharynx is clear and moist.  Eyes: Conjunctivae are normal.  Neck: Neck supple.  Cardiovascular: Normal rate and regular rhythm.   No murmur heard. Pulmonary/Chest: Effort normal and breath sounds normal. No respiratory distress.  Abdominal: Soft. There is tenderness.  Right upper quadrant tenderness palpation. Right CVA tenderness.  Musculoskeletal: She exhibits no edema or tenderness.  Neurological: She is alert and oriented to person, place, and time. No cranial nerve deficit. She exhibits normal muscle tone. Coordination normal.  Skin: Skin is warm and dry.  Psychiatric: She has a normal mood and affect.  Nursing note and vitals reviewed.    ED Treatments / Results  Labs (all labs ordered are listed, but only abnormal results are displayed) Labs Reviewed  BASIC METABOLIC PANEL - Abnormal; Notable for the following:       Result Value    Sodium 129 (*)    Chloride 92 (*)    Glucose, Bld 128 (*)    All other components within normal limits  CBC - Abnormal; Notable for the following:    WBC 14.2 (*)    All other components within normal limits  HEPATIC FUNCTION PANEL - Abnormal; Notable for the following:    Total Bilirubin 1.8 (*)    Indirect Bilirubin 1.5 (*)    All other components within normal limits  URINALYSIS, ROUTINE W REFLEX MICROSCOPIC - Abnormal; Notable for the following:    Ketones, ur 20 (*)    All other components within normal limits  COMPREHENSIVE METABOLIC PANEL - Abnormal; Notable for the following:    Sodium 132 (*)  Chloride 98 (*)    Glucose, Bld 113 (*)    Calcium 8.5 (*)    Total Protein 6.3 (*)    Albumin 3.4 (*)    Total Bilirubin 1.7 (*)    All other components within normal limits  CBG MONITORING, ED - Abnormal; Notable for the following:    Glucose-Capillary 115 (*)    All other components within normal limits  SURGICAL PCR SCREEN  LIPASE, BLOOD  I-STAT TROPOININ, ED  SURGICAL PATHOLOGY    EKG  EKG Interpretation  Date/Time:  Monday July 30 2016 08:03:33 EST Ventricular Rate:  81 PR Interval:    QRS Duration: 116 QT Interval:  422 QTC Calculation: 490 R Axis:   9 Text Interpretation:  Sinus rhythm Incomplete right bundle branch block Low voltage, precordial leads agree. no STEMI.  no old comparison Confirmed by Donnald Garre, MD, Lebron Conners [phone]) on 07/30/2016 8:09:47 AM       Radiology Dg Chest 2 View  Result Date: 07/30/2016 CLINICAL DATA:  Chest pressure. EXAM: CHEST  2 VIEW COMPARISON:  No recent prior. FINDINGS: Mediastinum and hilar structures are normal. Lungs are clear. No pleural effusion or pneumothorax. Heart size normal. Degenerative changes thoracic spine. IMPRESSION: No acute cardiopulmonary disease. Electronically Signed   By: Maisie Fus  Register   On: 07/30/2016 08:39   Dg Cholangiogram Operative  Result Date: 07/30/2016 CLINICAL DATA:  Cholelithiasis EXAM:  INTRAOPERATIVE CHOLANGIOGRAM TECHNIQUE: Cholangiographic images from the C-arm fluoroscopic device were submitted for interpretation post-operatively. Please see the procedural report for the amount of contrast and the fluoroscopy time utilized. COMPARISON:  Ultrasound 07/30/2016 FINDINGS: No persistent filling defects in the common duct. Intrahepatic ducts are incompletely visualized, appearing decompressed centrally. Contrast passes into the duodenum. : Negative for retained common duct stone. Electronically Signed   By: Corlis Leak M.D.   On: 07/30/2016 15:09   US Abdomen Limited Ruq  Result Date: 07/30/2016 CLINICAL DATA:  Acute right upper quadrant abdominal pain. EXAM: US ABDOMEN LIMITED - RIGHT UPPER QUADRANT COMPARISON:  None. FINDINGS: Gallbladder: 2.2 cm gallstone is seen in neck of gallbladder. No gallbladder wall thickening or pericholecystic fluid is noted. No sonographic Murphy's sign is noted. Common bile duct: Diameter: 7 mm which is mildly dilated. Distal portion is not well visualized due to overlying bowel gas. Liver: No focal lesion identified. Increased echogenicity is noted suggesting fatty infiltration. IMPRESSION: 2.2 cm gallstone seen in neck of gallbladder without gallbladder wall thickening or pericholecystic fluid. Proximal common bile duct is mildly dilated at 7 mm. Correlation with liver function tests is recommended to rule out distal common bile duct obstruction. Distal common bile duct is not well visualized due to overlying bowel gas. Increased echogenicity of hepatic parenchyma is noted suggesting fatty infiltration. Electronically Signed   By: Lupita Raider, M.D.   On: 07/30/2016 09:13    Procedures Procedures (including critical care time)  Medications Ordered in ED Medications  HYDROmorphone (DILAUDID) 2 MG/ML injection (not administered)  0.9 %  sodium chloride infusion ( Intravenous Transfusing/Transfer 07/30/16 1302)  morphine 4 MG/ML injection 4 mg (4 mg  Intravenous Given 07/30/16 0836)  ondansetron (ZOFRAN) injection 4 mg (4 mg Intravenous Given 07/30/16 1241)     Initial Impression / Assessment and Plan / ED Course  I have reviewed the triage vital signs and the nursing notes.  Pertinent labs & imaging results that were available during my care of the patient were reviewed by me and considered in my medical decision making (see chart for  details).    consult: Gen. surgery to evaluate in the emergency department.  Final Clinical Impressions(s) / ED Diagnoses   Final diagnoses:  Abdominal pain  Cholecystitis    New Prescriptions Discharge Medication List as of 07/31/2016 12:04 PM    START taking these medications   Details  acetaminophen (TYLENOL) 325 MG tablet You can take 2 tablets every 4 hours as needed for pain. This pain medication is in your prescribed medicine. You cannot take more than 4000 mg of acetaminophen per day., No Print    oxyCODONE (OXY IR/ROXICODONE) 5 MG immediate release tablet Take 1-2 tablets (5-10 mg total) by mouth every 4 (four) hours as needed for moderate pain or breakthrough pain., Starting Tue 07/31/2016, Print         Arby Barrette, MD 08/01/16 [phone]

## 2016-07-30 NOTE — ED Notes (Signed)
Pt transported to OR. Patient label placed on belongings bags (2 bags total), and sent to [facility]. Jeanie CooksMacy received.

## 2016-07-30 NOTE — Op Note (Signed)
Preoperative diagnosis: Cholelithiasis and acute cholecystitis  Postoperative diagnosis: Cholelithiasis and acute cholecystitis  Surgical procedure: Laparoscopic cholecystectomy with intraoperative cholangiogram  Surgeon: Sharlet SalinaBenjamin T. Rhiannan Kievit M.D.  Assistant: None  Anesthesia: General Endotracheal  Complications: None  Estimated blood loss: Minimal  Description of procedure: The patient brought to the operating room, placed in the supine position on the operating table, and general endotracheal anesthesia induced. The abdomen was widely sterilely prepped and draped. The patient had received preoperative IV antibiotics and PAS were in place. Patient timeout was performed the correct procedure verified. Standard 4 port technique was used with an open Hassan cannula at the umbilicus and the remainder of the ports placed under direct vision. The gallbladder was visualized. It appeared inflamed, edematous and tensely distended with some exudate.  It was decompressed with an aspiration needle The fundus was grasped and elevated up over the liver and the infundibulum retracted inferiolaterally. Peritoneum anterior and posterior to close triangle was incised and fibrofatty tissue stripped off the neck of the gallbladder toward the porta hepatis. The distal gallbladder was thoroughly dissected. The cystic artery was identified in Calot's triangle and the cystic duct gallbladder junction dissected 360.  A good critical view was obtained. When the anatomy was clear the cystic duct was clipped at the gallbladder junction and an operative cholangiogram obtained through the cystic duct. This showed good filling of a normal common bile duct and intrahepatic ducts with free flow into the duodenum and no filling defects. Following this the cholangiocath was removed and the cystic duct was doubly clipped proximally and divided. The cystic artery was doubly clipped proximally and distally and divided. The gallbladder  was dissected free from its bed using hook cautery and removed through the umbilical port site. Complete hemostasis was obtained in the gallbladder bed. The right upper quadrant was thoroughly irrigated and hemostasis assured. Trochars were removed and all CO2 evacuated and the [facility] tro[facility] site fascial defect closed. Skin incisions were closed with subcuticular Monocryl and Dermabond. Sponge needle and instrument counts were correct. The patient was taken to PACU in good condition.  Mauricio Dahlen T  07/30/2016

## 2016-07-30 NOTE — ED Notes (Signed)
Last solid food 07/29/16 2000, last liquid 07/30/16 0400.

## 2016-07-30 NOTE — ED Notes (Signed)
Surgical pa at bedside ?

## 2016-07-30 NOTE — Anesthesia Procedure Notes (Signed)
Procedure Name: Intubation Date/Time: 07/30/2016 2:00 PM Performed by: Dione Booze Pre-anesthesia Checklist: Emergency Drugs available, Suction available, Patient being monitored and Patient identified Patient Re-evaluated:Patient Re-evaluated prior to inductionOxygen Delivery Method: Circle system utilized Preoxygenation: Pre-oxygenation with 100% oxygen Intubation Type: IV induction, Rapid sequence and Cricoid Pressure applied Laryngoscope Size: Mac and 4 Grade View: Grade I Tube type: Oral Tube size: 7.5 mm Number of attempts: 1 Airway Equipment and Method: Stylet Placement Confirmation: ETT inserted through vocal cords under direct vision,  positive ETCO2 and breath sounds checked- equal and bilateral Secured at: 21 cm Tube secured with: Tape Dental Injury: Teeth and Oropharynx as per pre-operative assessment

## 2016-07-30 NOTE — Progress Notes (Signed)
PATIENT HAS MEDS FROM HOME-REPORTS THAT SON WILL TAKE THEM

## 2016-07-30 NOTE — ED Triage Notes (Signed)
Pt reports she began to have CP that radiated up her back last night that has not gone away. Pt denies SOB, but thinks she is not taking breaths as deep as normal. No dizziness

## 2016-07-30 NOTE — Anesthesia Postprocedure Evaluation (Signed)
Anesthesia Post Note  Patient: Caroline Martin  Procedure(s) Performed: Procedure(s) (LRB): LAPAROSCOPIC CHOLECYSTECTOMY WITH INTRAOPERATIVE CHOLANGIOGRAM (N/A)  Patient location during evaluation: PACU Anesthesia Type: General Level of consciousness: awake Pain management: pain level controlled Vital Signs Assessment: post-procedure vital signs reviewed and stable Respiratory status: spontaneous breathing Cardiovascular status: stable Anesthetic complications: no (see Quick Note. front tooth veneer missing)       Last Vitals:  Vitals:   07/30/16 1649 07/30/16 1700  BP:  133/78  Pulse: 85 82  Resp: (!) 24 14  Temp:      Last Pain:  Vitals:   07/30/16 1700  TempSrc:   PainSc: 4                  Ellyssa Zagal

## 2016-07-30 NOTE — Transfer of Care (Signed)
Immediate Anesthesia Transfer of Care Note  Patient: Dario Guardianlay B Perfecto  Procedure(s) Performed: Procedure(s): LAPAROSCOPIC CHOLECYSTECTOMY WITH INTRAOPERATIVE CHOLANGIOGRAM (N/A)  Patient Location: PACU  Anesthesia Type:General  Level of Consciousness: awake, alert  and patient cooperative  Airway & Oxygen Therapy: Patient Spontanous Breathing and Patient connected to face mask oxygen  Post-op Assessment: Report given to RN and Post -op Vital signs reviewed and stable  Post vital signs: Reviewed and stable  Last Vitals:  Vitals:   07/30/16 0757 07/30/16 0813  BP: 140/97 162/99  Pulse: 85 82  Resp: 16 17  Temp: 36.8 C     Last Pain:  Vitals:   07/30/16 1323  TempSrc:   PainSc: 3          Complications: No apparent anesthesia complications

## 2016-07-30 NOTE — Anesthesia Preprocedure Evaluation (Addendum)
Anesthesia Evaluation  Patient identified by MRN, date of birth, ID band Patient awake  General Assessment Comment:History noted. CG  Reviewed: Allergy & Precautions, NPO status , Patient's Chart, lab work & pertinent test results  Airway Mallampati: II  TM Distance: >3 FB     Dental   Pulmonary neg pulmonary ROS,    breath sounds clear to auscultation       Cardiovascular hypertension,  Rhythm:Regular Rate:Normal     Neuro/Psych    GI/Hepatic Neg liver ROS, GERD  ,  Endo/Other  negative endocrine ROS  Renal/GU negative Renal ROS     Musculoskeletal   Abdominal   Peds  Hematology   Anesthesia Other Findings   Reproductive/Obstetrics                             Anesthesia Physical Anesthesia Plan  ASA: III  Anesthesia Plan: General   Post-op Pain Management:    Induction: Intravenous, Rapid sequence and Cricoid pressure planned  Airway Management Planned: Oral ETT  Additional Equipment:   Intra-op Plan:   Post-operative Plan: Extubation in OR  Informed Consent: I have reviewed the patients History and Physical, chart, labs and discussed the procedure including the risks, benefits and alternatives for the proposed anesthesia with the patient or authorized representative who has indicated his/her understanding and acceptance.   Dental advisory given  Plan Discussed with: CRNA and Anesthesiologist  Anesthesia Plan Comments:         Anesthesia Quick Evaluation

## 2016-07-31 LAB — COMPREHENSIVE METABOLIC PANEL
ALT: 23 U/L (ref 14–54)
AST: 27 U/L (ref 15–41)
Albumin: 3.4 g/dL — ABNORMAL LOW (ref 3.5–5.0)
Alkaline Phosphatase: 59 U/L (ref 38–126)
Anion gap: 7 (ref 5–15)
BUN: 8 mg/dL (ref 6–20)
CO2: 27 mmol/L (ref 22–32)
CREATININE: 0.73 mg/dL (ref 0.44–1.00)
Calcium: 8.5 mg/dL — ABNORMAL LOW (ref 8.9–10.3)
Chloride: 98 mmol/L — ABNORMAL LOW (ref 101–111)
GFR calc Af Amer: >60 mL/min (ref >60)
Glucose, Bld: 113 mg/dL — ABNORMAL HIGH (ref 65–99)
POTASSIUM: 3.7 mmol/L (ref 3.5–5.1)
Sodium: 132 mmol/L — ABNORMAL LOW (ref 135–145)
TOTAL PROTEIN: 6.3 g/dL — AB (ref 6.5–8.1)
Total Bilirubin: 1.7 mg/dL — ABNORMAL HIGH (ref 0.3–1.2)

## 2016-07-31 MED ORDER — ACETAMINOPHEN 325 MG PO TABS: 650.0000 mg | ORAL_TABLET | Freq: Four times a day (QID) | ORAL | Status: DC | PRN

## 2016-07-31 MED ORDER — IBUPROFEN 200 MG PO TABS: 600.0000 mg | ORAL_TABLET | Freq: Four times a day (QID) | ORAL | Status: DC | PRN

## 2016-07-31 MED ORDER — OXYCODONE HCL 5 MG PO TABS: 5.0000 mg | ORAL_TABLET | ORAL | 0 refills | Status: DC | PRN

## 2016-07-31 MED ORDER — ACETAMINOPHEN 325 MG PO TABS: ORAL_TABLET | ORAL | Status: DC

## 2016-07-31 MED ORDER — IBUPROFEN 200 MG PO TABS: ORAL_TABLET | ORAL | Status: DC

## 2016-07-31 NOTE — Care Management Obs Status (Signed)
MEDICARE OBSERVATION STATUS NOTIFICATION   Patient Details  Name: Caroline Martin MRN: [id number] Date of Birth: Aug 14, 1945   Medicare Observation Status Notification Given:  Yes    Alexis Goodell, RN 07/31/2016, 12:32 PM

## 2016-07-31 NOTE — Progress Notes (Signed)
1 Day Post-Op  Subjective: He looks good this a.m. sore from the port sites but otherwise much improved. Plan to mobilize her, allow her to a breakfast, and try by mouth pain medicine. Discharge home later this a.m.  Objective: Vital signs in last 24 hours: Temp:  [97.7 F (36.5 C)-98.7 F (37.1 C)] 98.3 F (36.8 C) (02/27 0451) Pulse Rate:  [81-91] 88 (02/27 0451) Resp:  [14-24] 18 (02/27 0451) BP: (108-167)/(52-99) 115/52 (02/27 0451) SpO2:  [93 %-100 %] 96 % (02/27 0451) Weight:  [94.3 kg (208 lb)] 94.3 kg (208 lb) (02/26 2207) Last BM Date: 07/29/16 100 Po 1700 IV Urine 1700 Afebrile vital signs are stable. Labs are stable. IOC was negative for retained common duct stones.  Intake/Output from previous day: 02/26 0701 - 02/27 0700 In: 1868.8 [P.O.:100; I.V.:1768.8] Out: 1800 [Urine:1700; Blood:100] Intake/Output this shift: No intake/output data recorded.  General appearance: alert, cooperative and no distress Resp: clear to auscultation bilaterally GI: Soft, sore, sites all look good.  Lab Results:   Recent Labs  07/30/16 0811  WBC 14.2*  HGB 14.5  HCT 41.7  PLT 242    BMET  Recent Labs  07/30/16 0811 07/31/16 0425  NA 129* 132*  K 3.5 3.7  CL 92* 98*  CO2 27 27  GLUCOSE 128* 113*  BUN 12 8  CREATININE 0.73 0.73  CALCIUM 8.9 8.5*   PT/INR No results for input(s): LABPROT, INR in the last 72 hours.   Recent Labs Lab 07/30/16 0811 07/31/16 0425  AST 30 27  ALT 26 23  ALKPHOS 73 59  BILITOT 1.8* 1.7*  PROT 7.4 6.3*  ALBUMIN 4.3 3.4*     Lipase     Component Value Date/Time   LIPASE 16 07/30/2016 0811     Studies/Results: Dg Chest 2 View  Result Date: 07/30/2016 CLINICAL DATA:  Chest pressure. EXAM: CHEST  2 VIEW COMPARISON:  No recent prior. FINDINGS: Mediastinum and hilar structures are normal. Lungs are clear. No pleural effusion or pneumothorax. Heart size normal. Degenerative changes thoracic spine. IMPRESSION: No acute  cardiopulmonary disease. Electronically Signed   By: Maisie Fushomas  Register   On: 07/30/2016 08:39   Dg Cholangiogram Operative  Result Date: 07/30/2016 CLINICAL DATA:  Cholelithiasis EXAM: INTRAOPERATIVE CHOLANGIOGRAM TECHNIQUE: Cholangiographic images from the C-arm fluoroscopic device were submitted for interpretation post-operatively. Please see the procedural report for the amount of contrast and the fluoroscopy time utilized. COMPARISON:  Ultrasound 07/30/2016 FINDINGS: No persistent filling defects in the common duct. Intrahepatic ducts are incompletely visualized, appearing decompressed centrally. Contrast passes into the duodenum. : Negative for retained common duct stone. Electronically Signed   By: Corlis Leak  Hassell M.D.   On: 07/30/2016 15:09   [place] Abdomen Limited Ruq  Result Date: 07/30/2016 CLINICAL DATA:  Acute right upper quadrant abdominal pain. EXAM: US ABDOMEN LIMITED - RIGHT UPPER QUADRANT COMPARISON:  None. FINDINGS: Gallbladder: 2.2 cm gallstone is seen in neck of gallbladder. No gallbladder wall thickening or pericholecystic fluid is noted. No sonographic Murphy's sign is noted. Common bile duct: Diameter: 7 mm which is mildly dilated. Distal portion is not well visualized due to overlying bowel gas. Liver: No focal lesion identified. Increased echogenicity is noted suggesting fatty infiltration. IMPRESSION: 2.2 cm gallstone seen in neck of gallbladder without gallbladder wall thickening or pericholecystic fluid. Proximal common bile duct is mildly dilated at 7 mm. Correlation with liver function tests is recommended to rule out distal common bile duct obstruction. Distal common bile duct is not well  visualized due to overlying bowel gas. Increased echogenicity of hepatic parenchyma is noted suggesting fatty infiltration. Electronically Signed   By: Lupita Raider, M.D.   On: 07/30/2016 09:13    Medications: . cefTRIAXone (ROCEPHIN)  IV  2 g Intravenous Q24H  . DULoxetine  60 mg Oral Daily  .  ezetimibe-simvastatin  1 tablet Oral QHS  . hydrochlorothiazide  25 mg Oral Daily  . hydroxychloroquine  200 mg Oral BID  . pantoprazole  40 mg Oral Daily  . ramipril  10 mg Oral Daily   . dextrose 5 % and 0.9 % NaCl with KCl 40 mEq/L 75 mL/hr at 07/30/16 1825    Assessment/Plan Cholelithiasis and acute cholecystitis A/P laparoscopic cholecystectomy with intraoperative cholangiogram, 07/30/16, Dr. 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       LOS: 0 days    Caroline Martin 07/31/2016 [phone]

## 2016-07-31 NOTE — Discharge Instructions (Signed)
[facility] ______[facility] °LAPAROSCOPIC SURGERY: POST OP INSTRUCTIONS °Always review your discharge instruction sheet given to you by the facility where your surgery was performed. °IF YOU HAVE DISABILITY OR FAMILY LEAVE FORMS, YOU MUST BRING THEM TO THE OFFICE FOR PROCESSING.   °DO NOT GIVE THEM TO YOUR DOCTOR. ° °1. A prescription for pain medication may be given to you upon discharge.  Take your pain medication as prescribed, if needed.  If narcotic pain medicine is not needed, then you may take acetaminophen (Tylenol) or ibuprofen (Advil) as needed. °2. Take your usually prescribed medications unless otherwise directed. °3. If you need a refill on your pain medication, please contact your pharmacy.  They will contact our office to request authorization. Prescriptions will not be filled after 5pm or on week-ends. °4. You should follow a light diet the first few days after arrival home, such as soup and crackers, etc.  Be sure to include lots of fluids daily. °5. Most patients will experience some swelling and bruising in the area of the incisions.  Ice packs will help.  Swelling and bruising can take several days to resolve.  °6. It is common to experience some constipation if taking pain medication after surgery.  Increasing fluid intake and taking a stool softener (such as Colace) will usually help or prevent this problem from occurring.  A mild laxative (Milk of Magnesia or Miralax) should be taken according to package instructions if there are no bowel movements after 48 hours. °7. Unless discharge instructions indicate otherwise, you may remove your bandages 24-48 hours after surgery, and you may shower at that time.  You may have steri-strips (small skin tapes) in place directly over the incision.  These strips should be left on the skin for 7-10 days.  If your surgeon used skin glue on the incision, you may shower in 24 hours.  The glue will flake off over the next 2-3 weeks.  Any sutures or  staples will be removed at the office during your follow-up visit. °8. ACTIVITIES:  You may resume regular (light) daily activities beginning the next day--such as daily self-care, walking, climbing stairs--gradually increasing activities as tolerated.  You may have sexual intercourse when it is comfortable.  Refrain from any heavy lifting or straining until approved by your doctor. °a. You may drive when you are no longer taking prescription pain medication, you can comfortably wear a seatbelt, and you can safely maneuver your car and apply brakes. °b. RETURN TO WORK:  __________________________________________________________ °9. You should see your doctor in the office for a follow-up appointment approximately 2-3 weeks after your surgery.  Make sure that you call for this appointment within a day or two after you arrive home to insure a convenient appointment time. °10. OTHER INSTRUCTIONS: __________________________________________________________________________________________________________________________ __________________________________________________________________________________________________________________________ °WHEN TO CALL YOUR DOCTOR: °1. Fever over 101.0 °2. Inability to urinate °3. Continued bleeding from incision. °4. Increased pain, redness, or drainage from the incision. °5. Increasing abdominal pain ° °The clinic staff is available to answer your questions during regular business hours.  Please don’t hesitate to call and ask to speak to one of the nurses for clinical concerns.  If you have a medical emergency, go to the nearest emergency room or call 911.  A surgeon from [facility] is always on call at the hospital. °[address], [address], [place], [place]  [address] ? [address] [address], [place], [place]   [address] °[phone] ? [phone] ? FAX [phone] °Web site:   [email or web address] ° ° °Laparoscopic Cholecystectomy, Care After °This sheet  gives you information about how to care for yourself after your procedure. Your health care provider may also give you more specific instructions. If you have problems or questions, contact your health care provider. °What can I expect after the procedure? °After the procedure, it is common to have: °· Pain at your incision sites. You will be given medicines to control this pain. °· Mild nausea or vomiting. °· Bloating and possible shoulder pain from the air-like gas that was used during the procedure. ° °Follow these instructions at home: °Incision care ° °· Follow instructions from your health care provider about how to take care of your incisions. Make sure you: °? Wash your hands with soap and water before you change your bandage (dressing). If soap and water are not available, use hand sanitizer. °? Change your dressing as told by your health care provider. °? Leave stitches (sutures), skin glue, or adhesive strips in place. These skin closures may need to be in place for 2 weeks or longer. If adhesive strip edges start to loosen and curl up, you may trim the loose edges. Do not remove adhesive strips completely unless your health care provider tells you to do that. °· Do not take baths, swim, or use a hot tub until your health care provider approves. Ask your health care provider if you can take showers. You may only be allowed to take sponge baths for bathing. °· Check your incision area every day for signs of infection. Check for: °? More redness, swelling, or pain. °? More fluid or blood. °? Warmth. °? Pus or a bad smell. °Activity °· Do not drive or use heavy machinery while taking prescription pain medicine. °· Do not lift anything that is heavier than 10 lb (4.5 kg) until your health care provider approves. °· Do not play contact sports until your health care provider approves. °· Do not drive for 24 hours if you were given a medicine to help you relax (sedative). °· Rest as needed. Do not return to work  or school until your health care provider approves. °General instructions °· Take over-the-counter and prescription medicines only as told by your health care provider. °· To prevent or treat constipation while you are taking prescription pain medicine, your health care provider may recommend that you: °? Drink enough fluid to keep your urine clear or pale yellow. °? Take over-the-counter or prescription medicines. °? Eat foods that are high in fiber, such as fresh fruits and vegetables, whole grains, and beans. °? Limit foods that are high in fat and processed sugars, such as fried and sweet foods. °Contact a health care provider if: °· You develop a rash. °· You have more redness, swelling, or pain around your incisions. °· You have more fluid or blood coming from your incisions. °· Your incisions feel warm to the touch. °· You have pus or a bad smell coming from your incisions. °· You have a fever. °· One or more of your incisions breaks open. °Get help right away if: °· You have trouble breathing. °· You have chest pain. °· You have increasing pain in your shoulders. °· You faint or feel dizzy when you stand. °· You have severe pain in your abdomen. °· You have nausea or vomiting that lasts for more than one day. °· You have leg pain. °This information is not intended to replace advice given to you by your health care provider. Make sure you   discuss any questions you have with your health care provider. °Document Released: 05/21/2005 Document Revised: 12/10/2015 Document Reviewed: 11/07/2015 °Elsevier Interactive Patient Education © 2017 [employer] ° °

## 2016-08-02 NOTE — Discharge Summary (Signed)
Physician Discharge Summary  Patient ID: Caroline Martin MRN: [id number] DOB/AGE: 02-29-1946 71 y.o.  Admit date: 07/30/2016 Discharge date: 07/31/2016  Admission Diagnoses:   Acute: Cystitis/cholelithiasis. 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  Hospital Course:  Pt presented to the ED early this morning with complaints of chest pain. Pain developed in her back in her right upper thorax yesterday shooting pains in the right upper quadrant pain is worse lying flat.  She has not had this problem before. She does have occasional GERD symptoms after eating fatty foods. Workup shows she is afebrile and vital signs are stable. Blood pressure is up some. Labs shows sodium of 129 potassium of 3.5. Total bilirubin is up to 1.8 indirect is 1.5. AST,ALT, and alkaline phosphatase are normal. WBC is 14.2. Urinalysis is normal. EKG shows an incomplete right bundle branch block, no acute findings. Chest x-ray is normal. Abdominal ultrasound shows: 2.2 cm gallstone seen in neck of gallbladder without gallbladder wall thickening or pericholecystic fluid. Proximal common bile duct is mildly dilated at 7 mm. Correlation with liver function tests is recommended to rule out distal common bile duct obstruction. Distal common bile duct is not well visualized due to overlying bowel gas. Hepatic parenchyma suggestive of fatty infiltration. Patient was seen and evaluated by Dr. Johna [facility]. She was taken to the operating room later that PM. Following a.m. she was mobilized,  diet was advanced. She did well was ready for discharge later that a.m. She will  follow-up in the clinic in 2-3 weeks. Port sites all look good, she was tolerating diet well.  Condition ON discharge: Improved.  CBC CBC Latest Ref Rng & Units 07/30/2016 09/20/2011  WBC 4.0 - 10.5 K/uL 14.2(H) 6.7  Hemoglobin 12.0 - 15.0 g/dL 40.914.5 81.115.0  Hematocrit 91.436.0 - 46.0 % 41.7 44.9  Platelets 150 - 400 K/uL 242 285    CMP Latest Ref Rng & Units 07/31/2016 07/30/2016 09/20/2011  Glucose 65 - 99 mg/dL 782(N113(H) 562(Z128(H) 98  BUN 6 - 20 mg/dL 8 12 12   Creatinine 0.44 - 1.00 mg/dL 3.080.73 6.570.73 8.460.89  Sodium 135 - 145 mmol/L 132(L) 129(L) 137  Potassium 3.5 - 5.1 mmol/L 3.7 3.5 3.0(L)  Chloride 101 - 111 mmol/L 98(L) 92(L) 96  CO2 22 - 32 mmol/L 27 27 30   Calcium 8.9 - 10.3 mg/dL 9.6(E8.5(L) 8.9 9.5  Total Protein 6.5 - 8.1 g/dL 6.3(L) 7.4 -  Total Bilirubin 0.3 - 1.2 mg/dL 9.5(M1.7(H) 8.4(X1.8(H) -  Alkaline Phos 38 - 126 U/L 59 73 -  AST 15 - 41 U/L 27 30 -  ALT 14 - 54 U/L 23 26 -      Disposition: 01-Home or Self Care   Allergies as of 07/31/2016   No Known Allergies     Medication List    TAKE these medications   acetaminophen 325 MG tablet Commonly known as:  TYLENOL You can take 2 tablets every 4 hours as needed for pain. This pain medication is in your prescribed medicine. You cannot take more than 4000 mg of acetaminophen per day.   cholecalciferol 400 units Tabs tablet Commonly known as:  VITAMIN D Take 400 Units by mouth daily.   cimetidine 300 MG tablet Commonly known as:  TAGAMET Take 300 mg by  mouth 2 (two) times daily.   DULoxetine 60 MG capsule Commonly known as:  CYMBALTA Take 60 mg by mouth daily.   estradiol 2 MG vaginal ring Commonly known as:  ESTRING Place 2 mg vaginally every 3 (three) months. follow package directions   ezetimibe-simvastatin 10-40 MG tablet Commonly known as:  VYTORIN Take 1 tablet by mouth at bedtime.   hydrochlorothiazide 25 MG tablet Commonly known as:  HYDRODIURIL Take 25 mg by mouth daily.   hydroxychloroquine 200 MG  tablet Commonly known as:  PLAQUENIL Take 200 mg by mouth 2 (two) times daily. As directed   ibuprofen 200 MG tablet Commonly known as:  ADVIL,MOTRIN You take 2-3 tablets every 6 hours as needed for pain. I would use this first and use the prescribed medication second. You can also alternate this with plain Tylenol for pain relief. What changed:  how much to take  how to take this  when to take this  reasons to take this  additional instructions   multivitamin tablet Take 1 tablet by mouth daily.   NONFORMULARY OR COMPOUNDED ITEM estradiol 0.02% cream apply vaginally twice weekly   oxyCODONE 5 MG immediate release tablet Commonly known as:  Oxy IR/ROXICODONE Take 1 tablet (5 mg total) by mouth every 4 (four) hours as needed for moderate pain.   ramipril 10 MG tablet Commonly known as:  ALTACE Take 10 mg by mouth daily.   VITAMIN-B COMPLEX PO Take 1 capsule by mouth daily.      Follow-up Information    Julian Hy, MD Follow up.   Specialty:  Endocrinology Why:  Column make a follow-up appointment. Let him recheck your labs in a couple weeks also. Contact information: [address] [place] [address] [phone]        [facility] Follow up on 08/21/2016.   Specialty:  General Surgery Why:  Your appointment is at 11:15 AM. He at the office 30 minutes early for check-in. Bring photo ID and insurance information. Contact information: [address] [place] [address] [phone]           Signed: Sherrie George 08/02/2016, 6:59 PM

## 2017-01-16 ENCOUNTER — Encounter: Payer: Medicare Other | Admitting: Gynecology

## 2017-02-22 ENCOUNTER — Encounter: Payer: Medicare Other | Admitting: Gynecology

## 2017-03-05 ENCOUNTER — Encounter: Payer: Self-pay | Admitting: Gynecology

## 2017-03-05 ENCOUNTER — Ambulatory Visit (INDEPENDENT_AMBULATORY_CARE_PROVIDER_SITE_OTHER): Payer: Medicare Other | Admitting: Gynecology

## 2017-03-05 VITALS — BP 120/78 | Ht 64.0 in | Wt 200.0 lb

## 2017-03-05 DIAGNOSIS — Z9189 Other specified personal risk factors, not elsewhere classified: Secondary | ICD-10-CM | POA: Diagnosis not present

## 2017-03-05 DIAGNOSIS — Z01411 Encounter for gynecological examination (general) (routine) with abnormal findings: Secondary | ICD-10-CM | POA: Diagnosis not present

## 2017-03-05 DIAGNOSIS — Z124 Encounter for screening for malignant neoplasm of cervix: Secondary | ICD-10-CM | POA: Diagnosis not present

## 2017-03-05 DIAGNOSIS — N952 Postmenopausal atrophic vaginitis: Secondary | ICD-10-CM | POA: Diagnosis not present

## 2017-03-05 NOTE — Progress Notes (Signed)
    Caroline Martin 1945/11/08 [id number]        71 y.o.  [id number] for annual gynecologic exam.    Past medical history,surgical history, problem list, medications, allergies, family history and social history were all reviewed and documented as reviewed in the EPIC chart.  ROS:  Performed with pertinent positives and negatives included in the history, assessment and plan.   Additional significant findings :  None   Exam: Kennon Portela assistant Vitals:   03/05/17 1135  BP: 120/78  Weight: 200 lb (90.7 kg)  Height:  (1.626 m)   Body mass index is 34.33 kg/m.  General appearance:  Normal affect, orientation and appearance. Skin: Grossly normal HEENT: Without gross lesions.  No cervical or supraclavicular adenopathy. Thyroid normal.  Lungs:  Clear without wheezing, rales or rhonchi Cardiac: RR, without RMG Abdominal:  Soft, nontender, without masses, guarding, rebound, organomegaly or hernia Breasts:  Examined lying and sitting without masses, retractions, discharge or axillary adenopathy.  Bilateral implants noted Pelvic:  Ext, BUS, Vagina: Normal with atrophic changes  Cervix: Normal with atrophic changes. Pap smear of the cervix and upper vaginal cuff performed 360  Uterus: Anteverted, normal size, shape and contour, midline and mobile nontender   Adnexa: Without masses or tenderness    Anus and perineum: Normal   Rectovaginal: Normal sphincter tone without palpated masses or tenderness.    Assessment/Plan:  71 y.o. [id number] female for annual gynecologic exam.  1. Postmenopausal/atrophic genital changes. Had been using estradiol vaginal cream but her husband passed away earlier this year and she discontinued. Is not having significant hot flushes night sweats or vaginal dryness. No vaginal bleeding. Was using the cream due to dyspareunia. Will hold on estradiol cream now as she is not having daily symptoms. Will call if she becomes sexually active and wants to reinitiate the  cream. I again reviewed the risks to include absorption with systemic effects with possible breast, thrombosis and endometrial stimulation. 2. 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 Pap smear of cervix and upper vagina performed. History of prior surgery of her cervix when she was 22. Normal Pap smears since. 3. Mammography due now and I reminded the patient to schedule. Breast exam normal today. 4. Colonoscopy never. I again strongly recommended patient schedule a screening colonoscopy. Benefits of precancerous polyp removal and early detection reviewed. Patient acknowledges my recommendation but does not appear that she is going to schedule the appointment. She clearly understands the issues and risks. Names and numbers provided. 5. DEXA 2015 normal. Plan repeat DEXA at 5 year interval. 6. Health maintenance. No routine lab work done as patient reports is done elsewhere. Follow up 1 year, sooner as needed.    Dara Lords MD, 12:02 PM 03/05/2017

## 2017-03-05 NOTE — Addendum Note (Signed)
Addended by: Dayna Barker on: 03/05/2017 12:11 PM   Modules accepted: Orders

## 2017-03-05 NOTE — Patient Instructions (Signed)
Schedule your colonoscopy with either:  [facility]   Address: [address], [place], [place] [address]  [phone]    or  [facility]  Address: [address], [place], [place] [address]  [phone]      

## 2017-03-06 LAB — PAP IG W/ RFLX HPV ASCU

## 2017-03-13 DIAGNOSIS — L930 Discoid lupus erythematosus: Secondary | ICD-10-CM | POA: Insufficient documentation

## 2018-03-06 ENCOUNTER — Encounter: Payer: Medicare Other | Admitting: Gynecology

## 2018-04-21 ENCOUNTER — Encounter: Payer: Medicare Other | Admitting: Gynecology

## 2019-03-11 ENCOUNTER — Encounter: Payer: Self-pay | Admitting: Gynecology

## 2020-03-22 ENCOUNTER — Observation Stay (HOSPITAL_COMMUNITY)
Admission: EM | Admit: 2020-03-22 | Discharge: 2020-03-29 | Disposition: A | Discharge status: Home / Self Care | Payer: Medicare PPO | Attending: Internal Medicine | Admitting: Internal Medicine

## 2020-03-22 ENCOUNTER — Other Ambulatory Visit: Payer: Self-pay

## 2020-03-22 ENCOUNTER — Encounter (HOSPITAL_COMMUNITY): Payer: Self-pay

## 2020-03-22 ENCOUNTER — Emergency Department (HOSPITAL_COMMUNITY): Payer: Medicare PPO

## 2020-03-22 DIAGNOSIS — I10 Essential (primary) hypertension: Secondary | ICD-10-CM | POA: Insufficient documentation

## 2020-03-22 DIAGNOSIS — S2231XA Fracture of one rib, right side, initial encounter for closed fracture: Secondary | ICD-10-CM | POA: Insufficient documentation

## 2020-03-22 DIAGNOSIS — Z20822 Contact with and (suspected) exposure to covid-19: Secondary | ICD-10-CM | POA: Insufficient documentation

## 2020-03-22 DIAGNOSIS — S8991XA Unspecified injury of right lower leg, initial encounter: Secondary | ICD-10-CM | POA: Diagnosis present

## 2020-03-22 DIAGNOSIS — S82101A Unspecified fracture of upper end of right tibia, initial encounter for closed fracture: Secondary | ICD-10-CM | POA: Diagnosis not present

## 2020-03-22 DIAGNOSIS — R0602 Shortness of breath: Secondary | ICD-10-CM

## 2020-03-22 DIAGNOSIS — Z79899 Other long term (current) drug therapy: Secondary | ICD-10-CM | POA: Insufficient documentation

## 2020-03-22 DIAGNOSIS — M6281 Muscle weakness (generalized): Secondary | ICD-10-CM | POA: Diagnosis not present

## 2020-03-22 DIAGNOSIS — S82209A Unspecified fracture of shaft of unspecified tibia, initial encounter for closed fracture: Secondary | ICD-10-CM | POA: Diagnosis present

## 2020-03-22 DIAGNOSIS — S2239XA Fracture of one rib, unspecified side, initial encounter for closed fracture: Secondary | ICD-10-CM | POA: Diagnosis present

## 2020-03-22 LAB — BASIC METABOLIC PANEL
Anion gap: 9 (ref 5–15)
BUN: 17 mg/dL (ref 8–23)
CO2: 26 mmol/L (ref 22–32)
Calcium: 9.1 mg/dL (ref 8.9–10.3)
Chloride: 98 mmol/L (ref 98–111)
Creatinine, Ser: 0.81 mg/dL (ref 0.44–1.00)
GFR, Estimated: >60 mL/min (ref >60)
Glucose, Bld: 117 mg/dL — ABNORMAL HIGH (ref 70–99)
Potassium: 4.3 mmol/L (ref 3.5–5.1)
Sodium: 133 mmol/L — ABNORMAL LOW (ref 135–145)

## 2020-03-22 LAB — RESPIRATORY PANEL BY RT PCR (FLU A&B, COVID)
Influenza A by PCR: NEGATIVE
Influenza B by PCR: NEGATIVE
SARS Coronavirus 2 by RT PCR: NEGATIVE

## 2020-03-22 LAB — CBC WITH DIFFERENTIAL/PLATELET
Abs Immature Granulocytes: 0.05 10*3/uL (ref 0.00–0.07)
Basophils Absolute: 0.1 10*3/uL (ref 0.0–0.1)
Basophils Relative: 1 %
Eosinophils Absolute: 0 10*3/uL (ref 0.0–0.5)
Eosinophils Relative: 0 %
HCT: 38.2 % (ref 36.0–46.0)
Hemoglobin: 11.6 g/dL — ABNORMAL LOW (ref 12.0–15.0)
Immature Granulocytes: 0 %
Lymphocytes Relative: 8 %
Lymphs Abs: 1.3 10*3/uL (ref 0.7–4.0)
MCH: 22.9 pg — ABNORMAL LOW (ref 26.0–34.0)
MCHC: 30.4 g/dL (ref 30.0–36.0)
MCV: 75.5 fL — ABNORMAL LOW (ref 80.0–100.0)
Monocytes Absolute: 0.9 10*3/uL (ref 0.1–1.0)
Monocytes Relative: 6 %
Neutro Abs: 13.2 10*3/uL — ABNORMAL HIGH (ref 1.7–7.7)
Neutrophils Relative %: 85 %
Platelets: 393 10*3/uL (ref 150–400)
RBC: 5.06 MIL/uL (ref 3.87–5.11)
RDW: 16 % — ABNORMAL HIGH (ref 11.5–15.5)
WBC: 15.5 10*3/uL — ABNORMAL HIGH (ref 4.0–10.5)
nRBC: 0 % (ref 0.0–0.2)

## 2020-03-22 MED ORDER — HYDROCHLOROTHIAZIDE 25 MG PO TABS: 25.0000 mg | ORAL_TABLET | Freq: Every day | ORAL | Status: DC

## 2020-03-22 MED ORDER — FENTANYL CITRATE (PF) 100 MCG/2ML IJ SOLN
50.0000 ug | Freq: Once | INTRAMUSCULAR | Status: AC
  Administered 2020-03-22: 50 ug via INTRAVENOUS
  Filled 2020-03-22: qty 2

## 2020-03-22 MED ORDER — EZETIMIBE-SIMVASTATIN 10-40 MG PO TABS
1.0000 | ORAL_TABLET | Freq: Every day | ORAL | Status: DC
  Administered 2020-03-22 – 2020-03-28 (×7): 1 via ORAL
  Filled 2020-03-22 (×7): qty 1

## 2020-03-22 MED ORDER — METHOCARBAMOL 500 MG PO TABS
500.0000 mg | ORAL_TABLET | Freq: Four times a day (QID) | ORAL | Status: DC | PRN
  Administered 2020-03-22 – 2020-03-29 (×10): 500 mg via ORAL
  Filled 2020-03-22 (×10): qty 1

## 2020-03-22 MED ORDER — DULOXETINE HCL 60 MG PO CPEP
60.0000 mg | ORAL_CAPSULE | Freq: Every day | ORAL | Status: DC
  Administered 2020-03-23 – 2020-03-29 (×7): 60 mg via ORAL
  Filled 2020-03-22 (×7): qty 1

## 2020-03-22 MED ORDER — RAMIPRIL 10 MG PO CAPS
10.0000 mg | ORAL_CAPSULE | Freq: Every day | ORAL | Status: DC
  Administered 2020-03-22 – 2020-03-29 (×8): 10 mg via ORAL
  Filled 2020-03-22 (×8): qty 1

## 2020-03-22 MED ORDER — MORPHINE SULFATE (PF) 2 MG/ML IV SOLN
1.0000 mg | INTRAVENOUS | Status: DC | PRN
  Administered 2020-03-23 – 2020-03-24 (×4): 1 mg via INTRAVENOUS
  Filled 2020-03-22 (×4): qty 1

## 2020-03-22 MED ORDER — RAMIPRIL 10 MG PO TABS: 10.0000 mg | ORAL_TABLET | Freq: Every day | ORAL | Status: DC

## 2020-03-22 MED ORDER — OXYCODONE-ACETAMINOPHEN 5-325 MG PO TABS
1.0000 | ORAL_TABLET | Freq: Once | ORAL | Status: AC
  Administered 2020-03-22: 1 via ORAL
  Filled 2020-03-22: qty 1

## 2020-03-22 MED ORDER — FAMOTIDINE 20 MG PO TABS
20.0000 mg | ORAL_TABLET | Freq: Two times a day (BID) | ORAL | Status: DC
  Administered 2020-03-22 – 2020-03-29 (×14): 20 mg via ORAL
  Filled 2020-03-22 (×14): qty 1

## 2020-03-22 MED ORDER — ENOXAPARIN SODIUM 40 MG/0.4ML ~~LOC~~ SOLN: 40.0000 mg | SUBCUTANEOUS | Status: DC

## 2020-03-22 MED ORDER — HYDROCHLOROTHIAZIDE 25 MG PO TABS
25.0000 mg | ORAL_TABLET | Freq: Every day | ORAL | Status: DC
  Administered 2020-03-22 – 2020-03-28 (×7): 25 mg via ORAL
  Filled 2020-03-22 (×7): qty 1
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
## 2020-03-22 NOTE — Progress Notes (Signed)
Orthopedic Tech Progress Note Patient Details:  Caroline Martin November 12, 1945 [id number]  Ortho Devices Ortho Device/Splint Location: knee immobilizer Ortho Device/Splint Interventions: Ordered, Application   Post Interventions Patient Tolerated: Well Instructions Provided: Care of device   Jennye Moccasin 03/22/2020, 8:04 PM
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
## 2020-03-23 ENCOUNTER — Encounter (HOSPITAL_COMMUNITY): Payer: Self-pay | Admitting: Internal Medicine

## 2020-03-23 ENCOUNTER — Observation Stay (HOSPITAL_COMMUNITY): Payer: Medicare PPO

## 2020-03-23 DIAGNOSIS — S2231XA Fracture of one rib, right side, initial encounter for closed fracture: Secondary | ICD-10-CM | POA: Diagnosis not present

## 2020-03-23 DIAGNOSIS — I10 Essential (primary) hypertension: Secondary | ICD-10-CM | POA: Diagnosis not present

## 2020-03-23 MED ORDER — IOHEXOL 300 MG/ML  SOLN
100.0000 mL | Freq: Once | INTRAMUSCULAR | Status: AC | PRN
  Administered 2020-03-23: 100 mL via INTRAVENOUS
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
## 2020-03-23 NOTE — Progress Notes (Signed)
Orthopedic Tech Progress Note Patient Details:  Caroline Martin 1945-12-22 [id number]  Ortho Devices Type of Ortho Device: Crutches Ortho Device/Splint Location: knee immobilizer Ortho Device/Splint Interventions: Adjustment   Post Interventions Patient Tolerated: Well Instructions Provided: Adjustment of device, Care of device   [name] 03/23/2020, 8:31 AM

## 2020-03-23 NOTE — Progress Notes (Signed)
Inpatient Rehab Admissions Coordinator Note:   Per therapy recommendations, pt was screened for CIR candidacy by Estill Dooms, PT, DPT.  Noted pt is observation status at this time. Pt may not have the medical necessity to warrant an inpatient rehab stay if they remain observation. Pt also does not currently have a qualifying diagnosis for CIR.  Will not request a consult at this time.  Please call me with questions.   Estill Dooms, PT, DPT [phone] 03/23/20 2:31 PM
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
## 2020-03-24 MED ORDER — METHOCARBAMOL 500 MG PO TABS: 500.0000 mg | ORAL_TABLET | Freq: Four times a day (QID) | ORAL | 0 refills | Status: DC | PRN

## 2020-03-24 MED ORDER — ACETAMINOPHEN 325 MG PO TABS: 650.0000 mg | ORAL_TABLET | Freq: Four times a day (QID) | ORAL | Status: DC | PRN

## 2020-03-24 MED ORDER — MORPHINE SULFATE (PF) 2 MG/ML IV SOLN
1.0000 mg | INTRAVENOUS | Status: DC | PRN
  Administered 2020-03-27: 1 mg via INTRAVENOUS
  Filled 2020-03-24: qty 1

## 2020-03-24 MED ORDER — OXYCODONE-ACETAMINOPHEN 5-325 MG PO TABS
1.0000 | ORAL_TABLET | ORAL | 0 refills | Status: DC | PRN
Start: 2020-03-24 — End: 2020-03-29

## 2020-03-24 MED ORDER — OXYCODONE-ACETAMINOPHEN 5-325 MG PO TABS
1.0000 | ORAL_TABLET | ORAL | Status: DC | PRN
  Administered 2020-03-24 – 2020-03-25 (×4): 1 via ORAL
  Filled 2020-03-24 (×4): qty 1

## 2020-03-24 NOTE — Progress Notes (Signed)
Inpatient Rehab Admissions:  Inpatient Rehab Consult received.  Please see my note from 10/20.  Pt with observation status and non-operative tibia fracture.  He does not qualify for CIR.  Will sign off at this time and recommend f/u with therapy in an alternative level of care.   Signed: Estill Dooms, PT, DPT Admissions Coordinator [phone] 03/24/20  9:41 AM

## 2020-03-24 NOTE — TOC Progression Note (Addendum)
Transition of Care [facility]) - Progression Note    Patient Details  Name: VENITA SENG MRN: [id number] Date of Birth: 1945-07-09  Transition of Care [facility]) CM/SW Contact  Clearance Coots, LCSW Phone Number: 03/24/2020, 3:14 PM  Clinical Narrative:    Patient sister at bedside. She express concerns with taking the patient home and providing 24/7 care. Patient is agreeable to snf placement, preferably [facility] or [facility]. Patient and sister understand if the patient transports outside 50 miles by EMS it will be private pay. Patient sister will transport the patient.  CSW provided Medicare.gov list of SNF facilities.   Patient is agreeable to SNF in [place]/sent referrals.   CSW initiated [employer]. IWO#[id number]   Expected Discharge Plan: Skilled Nursing Facility Barriers to Discharge: Continued Medical Work up  Expected Discharge Plan and Services Expected Discharge Plan: Skilled Nursing Facility In-house Referral: Clinical Social Work Discharge Planning Services: CM Consult   Living arrangements for the past 2 months: Single Family Home Expected Discharge Date: 03/24/20               DME Arranged: Dan Humphreys rolling DME Agency: [employer] Date DME Agency Contacted: 03/24/20 Time DME Agency Contacted: [phone] Representative spoke with at DME Agency: Velna Hatchet             Social Determinants of Health (SDOH) Interventions    Readmission Risk Interventions No flowsheet data found.

## 2020-03-24 NOTE — Progress Notes (Signed)
Transition of Care (TOC) -30 day Note PASRR       Patient Details  Name: Caroline Martin  FWY:[id number] Date of Birth: 02/12/1946   [facility]) CM/SW Contact  Name: Vivi Barrack Phone Number: [phone] Date: 03/24/2020 Time:   MUST ID:    To Whom it May Concern:   Please be advised that the above patient will require a short-term nursing home stay, anticipated 30 days or less rehabilitation and strengthening. The plan is for return home.
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
## 2020-03-24 NOTE — [place] FL2 (Signed)
[place] MEDICAID FL2 LEVEL OF CARE SCREENING TOOL     IDENTIFICATION  Patient Name: Caroline Martin Birthdate: Jun 13, 1945 Sex: female Admission Date (Current Location): 03/22/2020  [facility] and [place] Number:  Producer, television/film/video and Address:  [facility],  [address] [place], [place] [address]      Provider Number: [id number]  Attending Physician Name and Address:  Jerald Kief, MD  Relative Name and Phone Number:  Heloise Beecham [phone]    Current Level of Care: Hospital Recommended Level of Care: Skilled Nursing Facility Prior Approval Number:    Date Approved/Denied:   PASRR Number:    Discharge Plan: SNF    Current Diagnoses: Patient Active Problem List   Diagnosis Date Noted   Tibial fracture 03/22/2020   Essential hypertension 03/22/2020   Closed fracture of one rib of right side 03/22/2020   Fracture, tibia 03/22/2020   Cholelithiasis and acute cholecystitis without obstruction 07/30/2016    Orientation RESPIRATION BLADDER Height & Weight     Self, Time, Situation, Place    Continent Weight: 200 lb (90.7 kg) Height:  5\' 5"  (165.1 cm)  BEHAVIORAL SYMPTOMS/MOOD NEUROLOGICAL BOWEL NUTRITION STATUS      Continent    AMBULATORY STATUS COMMUNICATION OF NEEDS Skin   Extensive Assist   Bruising                       Personal Care Assistance Level of Assistance  Bathing, Feeding, Dressing Bathing Assistance: Maximum assistance Feeding assistance: Independent Dressing Assistance: Maximum assistance     Functional Limitations Info  Sight, Hearing, Speech Sight Info: Adequate Hearing Info: Adequate Speech Info: Adequate    SPECIAL CARE FACTORS FREQUENCY  PT (By licensed PT), OT (By licensed OT)     PT Frequency: 5X/WEEK OT Frequency: 5X/WEEK            Contractures Contractures Info: Not present    Additional Factors Info  Code Status, Allergies, Psychotropic Code Status Info: Fullcode Allergies Info:  Allergies: No Known Allergies Psychotropic Info: Cymbalta         Current Medications (03/24/2020):  This is the current hospital active medication list Current Facility-Administered Medications  Medication Dose Route Frequency Provider Last Rate Last Admin   acetaminophen (TYLENOL) tablet 650 mg  650 mg Oral Q6H PRN 03/26/2020, MD       DULoxetine (CYMBALTA) DR capsule 60 mg  60 mg Oral Daily Jerald Kief, MD   60 mg at 03/24/20 03/26/20   ezetimibe-simvastatin (VYTORIN) 10-40 MG per tablet 1 tablet  1 tablet Oral QHS 6759, MD   1 tablet at 03/23/20 2148   famotidine (PEPCID) tablet 20 mg  20 mg Oral BID 2149, MD   20 mg at 03/24/20 03/26/20   hydrochlorothiazide (HYDRODIURIL) tablet 25 mg  25 mg Oral Daily 1638 K, NP   25 mg at 03/24/20 03/26/20   methocarbamol (ROBAXIN) tablet 500 mg  500 mg Oral Q6H PRN 4665, MD   500 mg at 03/24/20 03/26/20   morphine 2 MG/ML injection 1 mg  1 mg Intravenous Q4H PRN 9935, MD       oxyCODONE-acetaminophen (PERCOCET/ROXICET) 5-325 MG per tablet 1 tablet  1 tablet Oral Q4H PRN Jerald Kief, MD       ramipril (ALTACE) capsule 10 mg  10 mg Oral Daily Jerald Kief K, NP   10 mg at 03/24/20 [phone]  Discharge Medications: Please see discharge summary for a list of discharge medications.  Relevant Imaging Results:  Relevant Lab Results:   Additional Information [id number]  Clearance Coots, LCSW
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
## 2020-03-25 DIAGNOSIS — S82101A Unspecified fracture of upper end of right tibia, initial encounter for closed fracture: Secondary | ICD-10-CM | POA: Diagnosis not present

## 2020-03-25 DIAGNOSIS — I10 Essential (primary) hypertension: Secondary | ICD-10-CM | POA: Diagnosis not present

## 2020-03-25 DIAGNOSIS — S2231XA Fracture of one rib, right side, initial encounter for closed fracture: Secondary | ICD-10-CM | POA: Diagnosis not present

## 2020-03-25 MED ORDER — ALUM & MAG HYDROXIDE-SIMETH 200-200-20 MG/5ML PO SUSP
30.0000 mL | ORAL | Status: DC | PRN
  Administered 2020-03-25 – 2020-03-28 (×3): 30 mL via ORAL
  Filled 2020-03-25 (×3): qty 30

## 2020-03-25 MED ORDER — OXYCODONE-ACETAMINOPHEN 5-325 MG PO TABS
1.0000 | ORAL_TABLET | ORAL | Status: DC | PRN
  Administered 2020-03-25 – 2020-03-28 (×9): 2 via ORAL
  Filled 2020-03-25 (×9): qty 2

## 2020-03-25 MED ORDER — ACETAMINOPHEN 325 MG PO TABS
650.0000 mg | ORAL_TABLET | Freq: Four times a day (QID) | ORAL | Status: DC | PRN
  Administered 2020-03-26: 650 mg via ORAL
  Filled 2020-03-25: qty 2
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
## 2020-03-26 DIAGNOSIS — I10 Essential (primary) hypertension: Secondary | ICD-10-CM | POA: Diagnosis not present

## 2020-03-26 DIAGNOSIS — S2231XA Fracture of one rib, right side, initial encounter for closed fracture: Secondary | ICD-10-CM | POA: Diagnosis not present

## 2020-03-26 MED ORDER — IBUPROFEN 400 MG PO TABS
400.0000 mg | ORAL_TABLET | Freq: Four times a day (QID) | ORAL | Status: DC | PRN
  Administered 2020-03-26 – 2020-03-28 (×3): 400 mg via ORAL
  Filled 2020-03-26 (×3): qty 1
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
## 2020-03-27 DIAGNOSIS — I10 Essential (primary) hypertension: Secondary | ICD-10-CM | POA: Diagnosis not present

## 2020-03-27 DIAGNOSIS — S2239XA Fracture of one rib, unspecified side, initial encounter for closed fracture: Secondary | ICD-10-CM | POA: Diagnosis present

## 2020-03-27 DIAGNOSIS — S2231XA Fracture of one rib, right side, initial encounter for closed fracture: Secondary | ICD-10-CM | POA: Diagnosis not present

## 2020-03-27 LAB — COMPREHENSIVE METABOLIC PANEL
ALT: 19 U/L (ref 0–44)
AST: 19 U/L (ref 15–41)
Albumin: 3.7 g/dL (ref 3.5–5.0)
Alkaline Phosphatase: 84 U/L (ref 38–126)
Anion gap: 10 (ref 5–15)
BUN: 15 mg/dL (ref 8–23)
CO2: 30 mmol/L (ref 22–32)
Calcium: 9.1 mg/dL (ref 8.9–10.3)
Chloride: 87 mmol/L — ABNORMAL LOW (ref 98–111)
Creatinine, Ser: 0.69 mg/dL (ref 0.44–1.00)
GFR, Estimated: >60 mL/min (ref >60)
Glucose, Bld: 113 mg/dL — ABNORMAL HIGH (ref 70–99)
Potassium: 4 mmol/L (ref 3.5–5.1)
Sodium: 127 mmol/L — ABNORMAL LOW (ref 135–145)
Total Bilirubin: 2.1 mg/dL — ABNORMAL HIGH (ref 0.3–1.2)
Total Protein: 7.1 g/dL (ref 6.5–8.1)

## 2020-03-27 LAB — CBC
HCT: 36 % (ref 36.0–46.0)
Hemoglobin: 11.2 g/dL — ABNORMAL LOW (ref 12.0–15.0)
MCH: 23.2 pg — ABNORMAL LOW (ref 26.0–34.0)
MCHC: 31.1 g/dL (ref 30.0–36.0)
MCV: 74.7 fL — ABNORMAL LOW (ref 80.0–100.0)
Platelets: 390 10*3/uL (ref 150–400)
RBC: 4.82 MIL/uL (ref 3.87–5.11)
RDW: 16.2 % — ABNORMAL HIGH (ref 11.5–15.5)
WBC: 10.5 10*3/uL (ref 4.0–10.5)
nRBC: 0 % (ref 0.0–0.2)

## 2020-03-27 MED ORDER — ONDANSETRON HCL 4 MG PO TABS
4.0000 mg | ORAL_TABLET | Freq: Three times a day (TID) | ORAL | Status: DC | PRN
  Administered 2020-03-27: 4 mg via ORAL
  Filled 2020-03-27: qty 1

## 2020-03-27 MED ORDER — ONDANSETRON HCL 4 MG/2ML IJ SOLN: 4.0000 mg | Freq: Three times a day (TID) | INTRAMUSCULAR | Status: DC | PRN

## 2020-03-27 MED ORDER — SODIUM CHLORIDE 0.9 % IV SOLN: INTRAVENOUS | Status: AC
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
## 2020-03-27 NOTE — Progress Notes (Signed)
Patient vomited x1, PRN zofran 4 mg  administered and was effective. IV site restored for IV pain med when patient is nauseous.
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
## 2020-03-28 DIAGNOSIS — S2231XA Fracture of one rib, right side, initial encounter for closed fracture: Secondary | ICD-10-CM | POA: Diagnosis not present

## 2020-03-28 DIAGNOSIS — I10 Essential (primary) hypertension: Secondary | ICD-10-CM | POA: Diagnosis not present

## 2020-03-28 LAB — OSMOLALITY, URINE: Osmolality, Ur: 289 mOsm/kg — ABNORMAL LOW (ref 300–900)

## 2020-03-28 LAB — BASIC METABOLIC PANEL
Anion gap: 9 (ref 5–15)
BUN: 13 mg/dL (ref 8–23)
CO2: 29 mmol/L (ref 22–32)
Calcium: 9 mg/dL (ref 8.9–10.3)
Chloride: 89 mmol/L — ABNORMAL LOW (ref 98–111)
Creatinine, Ser: 0.73 mg/dL (ref 0.44–1.00)
GFR, Estimated: >60 mL/min (ref >60)
Glucose, Bld: 109 mg/dL — ABNORMAL HIGH (ref 70–99)
Potassium: 4.2 mmol/L (ref 3.5–5.1)
Sodium: 127 mmol/L — ABNORMAL LOW (ref 135–145)

## 2020-03-28 LAB — OSMOLALITY: Osmolality: 268 mOsm/kg — ABNORMAL LOW (ref 275–295)

## 2020-03-28 MED ORDER — OXYCODONE-ACETAMINOPHEN 5-325 MG PO TABS
1.0000 | ORAL_TABLET | ORAL | Status: DC | PRN
  Administered 2020-03-28 – 2020-03-29 (×5): 2 via ORAL
  Filled 2020-03-28 (×5): qty 2

## 2020-03-28 MED ORDER — IBUPROFEN 400 MG PO TABS: 400.0000 mg | ORAL_TABLET | Freq: Four times a day (QID) | ORAL | Status: DC | PRN

## 2020-03-28 MED ORDER — BUPROPION HCL ER (SR) 150 MG PO TB12: 150.0000 mg | ORAL_TABLET | Freq: Every day | ORAL | Status: DC

## 2020-03-28 MED ORDER — SACCHAROMYCES BOULARDII 250 MG PO CAPS
250.0000 mg | ORAL_CAPSULE | Freq: Two times a day (BID) | ORAL | Status: DC
  Administered 2020-03-28 – 2020-03-29 (×2): 250 mg via ORAL
  Filled 2020-03-28 (×2): qty 1
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
## 2020-03-29 DIAGNOSIS — I10 Essential (primary) hypertension: Secondary | ICD-10-CM | POA: Diagnosis not present

## 2020-03-29 DIAGNOSIS — S2231XA Fracture of one rib, right side, initial encounter for closed fracture: Secondary | ICD-10-CM | POA: Diagnosis not present

## 2020-03-29 LAB — COMPREHENSIVE METABOLIC PANEL
ALT: 17 U/L (ref 0–44)
AST: 20 U/L (ref 15–41)
Albumin: 3.4 g/dL — ABNORMAL LOW (ref 3.5–5.0)
Alkaline Phosphatase: 86 U/L (ref 38–126)
Anion gap: 12 (ref 5–15)
BUN: 14 mg/dL (ref 8–23)
CO2: 28 mmol/L (ref 22–32)
Calcium: 9.1 mg/dL (ref 8.9–10.3)
Chloride: 90 mmol/L — ABNORMAL LOW (ref 98–111)
Creatinine, Ser: 0.66 mg/dL (ref 0.44–1.00)
GFR, Estimated: >60 mL/min (ref >60)
Glucose, Bld: 94 mg/dL (ref 70–99)
Potassium: 4.3 mmol/L (ref 3.5–5.1)
Sodium: 130 mmol/L — ABNORMAL LOW (ref 135–145)
Total Bilirubin: 1.6 mg/dL — ABNORMAL HIGH (ref 0.3–1.2)
Total Protein: 6.6 g/dL (ref 6.5–8.1)

## 2020-03-29 MED ORDER — OXYCODONE-ACETAMINOPHEN 5-325 MG PO TABS: 1.0000 | ORAL_TABLET | ORAL | 0 refills | Status: DC | PRN
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
## 2020-03-29 NOTE — Care Management CC44 (Signed)
Condition Code 44 Documentation Completed  Patient Details  Name: Caroline Martin MRN: [id number] Date of Birth: 1945/07/14   Condition Code 44 given:  Yes Patient signature on Condition Code 44 notice:  Yes Documentation of 2 MD's agreement:  Yes Code 44 added to claim:  Yes    Armanda Heritage, RN 03/29/2020, 1:59 PM

## 2020-03-29 NOTE — Progress Notes (Signed)
Discharge instructions discussed with patient and family, verbalized agreement and understanding 

## 2020-03-29 NOTE — TOC Progression Note (Signed)
Transition of Care [facility]) - Progression Note    Patient Details  Name: Caroline Martin MRN: [id number] Date of Birth: Sep 06, 1945  Transition of Care [facility]) CM/SW Contact  Armanda Heritage, RN Phone Number: 03/29/2020, 12:49 PM  Clinical Narrative:   CM spoke with [facility] to follow-up regarding referral for [facility] services.  [facility] rep reviewed patient's case and states can accept patient for services.  Faxed requested clinicals with an updated service address and phone number to [facility].  Patient plans to discharge to her sister's home at [address] [facility] [place], [place] [address].     Expected Discharge Plan: Skilled Nursing Facility Barriers to Discharge: Continued Medical Work up  Expected Discharge Plan and Services Expected Discharge Plan: Skilled Nursing Facility In-house Referral: Clinical Social Work Discharge Planning Services: CM Consult   Living arrangements for the past 2 months: Single Family Home Expected Discharge Date: 03/29/20               DME Arranged: Dan Humphreys rolling DME Agency: [employer] Date DME Agency Contacted: 03/24/20 Time DME Agency Contacted: [phone] Representative spoke with at DME Agency: Velna Hatchet             Social Determinants of Health (SDOH) Interventions    Readmission Risk Interventions No flowsheet data found.

## 2020-03-29 NOTE — Care Management Obs Status (Signed)
MEDICARE OBSERVATION STATUS NOTIFICATION   Patient Details  Name: Caroline Martin MRN: [id number] Date of Birth: 1945-08-30   Medicare Observation Status Notification Given:  Yes    Armanda Heritage, RN 03/29/2020, 1:59 PM

## 2020-03-29 NOTE — Care Management Important Message (Signed)
Important Message  Patient Details IM Letter given to the Patient Name: CAOIMHE DAMRON MRN: [id number] Date of Birth: 1945-11-04   Medicare Important Message Given:  Yes     Caren Macadam 03/29/2020, 1:29 PM
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
## 2020-06-04 HISTORY — PX: ANTERIOR CRUCIATE LIGAMENT REPAIR: SHX115

## 2021-09-04 DIAGNOSIS — R051 Acute cough: Secondary | ICD-10-CM | POA: Diagnosis not present

## 2021-09-04 DIAGNOSIS — J069 Acute upper respiratory infection, unspecified: Secondary | ICD-10-CM | POA: Diagnosis not present

## 2021-11-20 ENCOUNTER — Other Ambulatory Visit: Payer: Self-pay | Admitting: Endocrinology

## 2021-11-20 DIAGNOSIS — Z1231 Encounter for screening mammogram for malignant neoplasm of breast: Secondary | ICD-10-CM

## 2021-11-24 ENCOUNTER — Inpatient Hospital Stay: Admission: RE | Admit: 2021-11-24 | Payer: Medicare PPO | Source: Ambulatory Visit

## 2021-11-30 DIAGNOSIS — Z471 Aftercare following joint replacement surgery: Secondary | ICD-10-CM | POA: Diagnosis not present

## 2021-11-30 DIAGNOSIS — M1711 Unilateral primary osteoarthritis, right knee: Secondary | ICD-10-CM | POA: Diagnosis not present

## 2021-12-22 ENCOUNTER — Ambulatory Visit
Admission: RE | Admit: 2021-12-22 | Discharge: 2021-12-22 | Disposition: A | Discharge status: Home / Self Care | Payer: Medicare PPO | Source: Ambulatory Visit | Attending: Endocrinology | Admitting: Endocrinology

## 2021-12-22 DIAGNOSIS — Z1231 Encounter for screening mammogram for malignant neoplasm of breast: Secondary | ICD-10-CM

## 2021-12-25 DIAGNOSIS — L930 Discoid lupus erythematosus: Secondary | ICD-10-CM | POA: Diagnosis not present

## 2021-12-25 DIAGNOSIS — I700 Atherosclerosis of aorta: Secondary | ICD-10-CM | POA: Diagnosis not present

## 2021-12-25 DIAGNOSIS — I10 Essential (primary) hypertension: Secondary | ICD-10-CM | POA: Diagnosis not present

## 2021-12-25 DIAGNOSIS — K219 Gastro-esophageal reflux disease without esophagitis: Secondary | ICD-10-CM | POA: Diagnosis not present

## 2021-12-25 DIAGNOSIS — E785 Hyperlipidemia, unspecified: Secondary | ICD-10-CM | POA: Diagnosis not present

## 2021-12-25 DIAGNOSIS — E871 Hypo-osmolality and hyponatremia: Secondary | ICD-10-CM | POA: Diagnosis not present

## 2021-12-25 DIAGNOSIS — F330 Major depressive disorder, recurrent, mild: Secondary | ICD-10-CM | POA: Diagnosis not present

## 2021-12-25 DIAGNOSIS — K760 Fatty (change of) liver, not elsewhere classified: Secondary | ICD-10-CM | POA: Diagnosis not present

## 2021-12-26 DIAGNOSIS — D649 Anemia, unspecified: Secondary | ICD-10-CM | POA: Diagnosis not present

## 2021-12-28 DIAGNOSIS — M25561 Pain in right knee: Secondary | ICD-10-CM | POA: Diagnosis not present

## 2022-01-05 DIAGNOSIS — M25561 Pain in right knee: Secondary | ICD-10-CM | POA: Diagnosis not present

## 2022-01-08 ENCOUNTER — Encounter: Payer: Self-pay | Admitting: Physician Assistant

## 2022-01-26 DIAGNOSIS — M25562 Pain in left knee: Secondary | ICD-10-CM | POA: Diagnosis not present

## 2022-02-07 ENCOUNTER — Other Ambulatory Visit: Payer: Self-pay | Admitting: Physician Assistant

## 2022-02-07 ENCOUNTER — Encounter: Payer: Self-pay | Admitting: Physician Assistant

## 2022-02-07 ENCOUNTER — Ambulatory Visit: Payer: Medicare PPO | Admitting: Physician Assistant

## 2022-02-07 ENCOUNTER — Other Ambulatory Visit (INDEPENDENT_AMBULATORY_CARE_PROVIDER_SITE_OTHER): Payer: Medicare PPO

## 2022-02-07 VITALS — BP 132/80 | HR 92 | Ht 62.75 in | Wt 198.4 lb

## 2022-02-07 DIAGNOSIS — D509 Iron deficiency anemia, unspecified: Secondary | ICD-10-CM | POA: Diagnosis not present

## 2022-02-07 LAB — IBC + FERRITIN
Ferritin: 7.3 ng/mL — ABNORMAL LOW (ref 10.0–291.0)
Iron: 24 ug/dL — ABNORMAL LOW (ref 42–145)
Saturation Ratios: 4.3 % — ABNORMAL LOW (ref 20.0–50.0)
TIBC: 557.2 ug/dL — ABNORMAL HIGH (ref 250.0–450.0)
Transferrin: 398 mg/dL — ABNORMAL HIGH (ref 212.0–360.0)

## 2022-02-07 LAB — COMPREHENSIVE METABOLIC PANEL
ALT: 14 U/L (ref 0–35)
AST: 22 U/L (ref 0–37)
Albumin: 4.5 g/dL (ref 3.5–5.2)
Alkaline Phosphatase: 78 U/L (ref 39–117)
BUN: 15 mg/dL (ref 6–23)
CO2: 29 mEq/L (ref 19–32)
Calcium: 9.7 mg/dL (ref 8.4–10.5)
Chloride: 97 mEq/L (ref 96–112)
Creatinine, Ser: 0.91 mg/dL (ref 0.40–1.20)
GFR: 62.24 mL/min (ref >60.00)
Glucose, Bld: 101 mg/dL — ABNORMAL HIGH (ref 70–99)
Potassium: 3.5 mEq/L (ref 3.5–5.1)
Sodium: 135 mEq/L (ref 135–145)
Total Bilirubin: 1.3 mg/dL — ABNORMAL HIGH (ref 0.2–1.2)
Total Protein: 7.6 g/dL (ref 6.0–8.3)

## 2022-02-07 LAB — CBC WITH DIFFERENTIAL/PLATELET
Basophils Absolute: 0.1 10*3/uL (ref 0.0–0.1)
Basophils Relative: 1 % (ref 0.0–3.0)
Eosinophils Absolute: 0 10*3/uL (ref 0.0–0.7)
Eosinophils Relative: 0.5 % (ref 0.0–5.0)
HCT: 36.4 % (ref 36.0–46.0)
Hemoglobin: 11.2 g/dL — ABNORMAL LOW (ref 12.0–15.0)
Lymphocytes Relative: 26.5 % (ref 12.0–46.0)
Lymphs Abs: 1.7 10*3/uL (ref 0.7–4.0)
MCHC: 30.7 g/dL (ref 30.0–36.0)
MCV: 67.8 fl — ABNORMAL LOW (ref 78.0–100.0)
Monocytes Absolute: 0.5 10*3/uL (ref 0.1–1.0)
Monocytes Relative: 8.1 % (ref 3.0–12.0)
Neutro Abs: 4.1 10*3/uL (ref 1.4–7.7)
Neutrophils Relative %: 63.9 % (ref 43.0–77.0)
Platelets: 395 10*3/uL (ref 150.0–400.0)
RBC: 5.37 Mil/uL — ABNORMAL HIGH (ref 3.87–5.11)
RDW: 20.2 % — ABNORMAL HIGH (ref 11.5–15.5)
WBC: 6.4 10*3/uL (ref 4.0–10.5)

## 2022-02-07 MED ORDER — NA SULFATE-K SULFATE-MG SULF 17.5-3.13-1.6 GM/177ML PO SOLN: 1.0000 | Freq: Once | ORAL | 0 refills | Status: AC

## 2022-02-07 NOTE — Progress Notes (Signed)
Chief Complaint: IDA  HPI:    Mrs. Caroline Martin is a 76 year old female, with a past medical history as listed below including lupus this, who was referred to me by Adrian Prince, MD for a complaint of IDA.      03/23/2020 CT then pelvis with contrast with nondisplaced midsternal fracture with associated minimal stranding in the anterior mediastinal fat, fractures of the anterior left sixth and right sixth and seventh and eighth ribs.  Distal colonic diverticulosis without evidence of diverticulitis.    03/27/2020 hemoglobin 11.2.  MCV low at 74.7.    06/10/2020 LFTs with a direct bili minimally elevated at 0.4 and otherwise normal.    02/15/2021 CMP normal.  CBC at that time with a hemoglobin of 11.7, MCV low at 74.    12/25/2021 hemoglobin 9.8, MCV low at 66.9.    Today, the patient tells me that she was just told she was anemic at the end of July and that she needed to come see [place].  On her own she started an oral iron supplement about 2 to 3 weeks ago and is taking it every other day.  Tells me prior to starting this she was having some leg aches and these have since gone away, other than that has not really noticed any changes.  Does tell me that occasionally she will have reflux and takes a Tagamet but was told this can be normal with lupus.  Denies any overt abdominal pain or change in bowel habits.    Describes a Cologuard 2 years ago which was negative.  No prior endoscopic evaluation.    Denies fever, chills, weight loss, change in bowel habits, blood in her stool, heartburn, reflux, nausea or vomiting.  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   SUI (stress urinary incontinence, female)     Past Surgical History:  Procedure Laterality Date   AUGMENTATION MAMMAPLASTY     CATARACT EXTRACTION     CHOLECYSTECTOMY N/A 07/30/2016   Procedure: LAPAROSCOPIC CHOLECYSTECTOMY WITH  INTRAOPERATIVE CHOLANGIOGRAM;  Surgeon: Glenna Fellows, MD;  Location: [facility];  Service: General;  Laterality: N/A;   CYSTOSCOPY  09/20/2011   Procedure: Bluford Kaufmann;  Surgeon: Martina Sinner, MD;  [facility];  Service: Urology;  Laterality: N/A;  [place] sling   PUBOVAGINAL SLING  2002   PUBOVAGINAL SLING  09/20/2011   Procedure: Leonides Grills;  Surgeon: Martina Sinner, MD;  Location[facility];  Service: Urology;  Laterality: N/A;    Current Outpatient Medications  Medication Sig Dispense Refill   acetaminophen (TYLENOL) 500 MG tablet Take 1,000 mg by mouth every 6 (six) hours as needed for mild pain.     acidophilus (RISAQUAD) CAPS capsule Take 1 capsule by mouth daily.     B Complex Vitamins (VITAMIN-B COMPLEX PO) Take 1 capsule by mouth daily as needed (vitamins).      Capsaicin-Menthol (SALONPAS GEL-PATCH HOT EX) Apply 1 patch topically daily as needed (pain).     cholecalciferol (VITAMIN D) 400 units TABS tablet Take 400 Units by mouth daily.     cimetidine (TAGAMET) 300 MG tablet Take 300 mg by mouth daily as needed (reflux).      DULoxetine (CYMBALTA) 60 MG capsule Take 60 mg by mouth daily.     ezetimibe-simvastatin (VYTORIN) 10-40 MG per tablet Take  1 tablet by mouth at bedtime.     finasteride (PROSCAR) 5 MG tablet Take 5 mg by mouth daily.     hydrochlorothiazide (HYDRODIURIL) 25 MG tablet Take 25 mg by mouth daily.     hydroxychloroquine (PLAQUENIL) 200 MG tablet Take 200 mg by mouth daily.     Multiple Vitamin (MULTIVITAMIN) tablet Take 1 tablet by mouth daily.     NONFORMULARY OR COMPOUNDED ITEM estradiol 0.02% cream apply vaginally twice weekly 90 each 4   oxyCODONE-acetaminophen (PERCOCET/ROXICET) 5-325 MG tablet Take 1 tablet by mouth every 4 (four) hours as needed for moderate pain. 20 tablet 0   ramipril (ALTACE) 10 MG tablet Take 10 mg by mouth daily.     No current facility-administered medications for this visit.     Allergies as of 02/07/2022   (No Known Allergies)    Family History  Problem Relation Age of Onset   Breast cancer Mother 71   Heart attack Father    Diabetes Son     Social History   Socioeconomic History   Marital status: Divorced    Spouse name: Not on file   Number of children: Not on file   Years of education: Not on file   Highest education level: Not on file  Occupational History   Not on file  Tobacco Use   Smoking status: Never   Smokeless tobacco: Never  Vaping Use   Vaping Use: Never used  Substance and Sexual Activity   Alcohol use: Yes    Alcohol/week: 4.0 standard drinks of alcohol    Types: 4 Standard drinks or equivalent per week    Comment: Occas.   Drug use: No   Sexual activity: Not Currently    Birth control/protection: Post-menopausal    Comment: 1st intercourse 76 yo-5 partners  Other Topics Concern   Not on file  Social History Narrative   Not on file   Social Determinants of Health   Financial Resource Strain: Not on file  Food Insecurity: Not on file  Transportation Needs: Not on file  Physical Activity: Not on file  Stress: Not on file  Social Connections: Not on file  Intimate Partner Violence: Not on file    Review of Systems:    Constitutional: No weight loss, fever or chills Skin: No rash  Cardiovascular: No chest pain Respiratory: No SOB  Gastrointestinal: See HPI and otherwise negative Genitourinary: No dysuria Neurological: No headache, dizziness or syncope Musculoskeletal: No new muscle or joint pain Hematologic: No bleeding  Psychiatric: No history of depression or anxiety   Physical Exam:  Vital signs: BP 132/80 (BP Location: Left Arm, Patient Position: Sitting, Cuff Size: Large)   Pulse 92   Ht 5' 2.75" (1.594 m) Comment: height measured without shoes  Wt 198 lb 6 oz (90 kg)   BMI 35.42 kg/m    Constitutional:   Pleasant female appears to be in NAD, Well developed, Well nourished, alert and  cooperative Head:  Normocephalic and atraumatic. Eyes:   PEERL, EOMI. No icterus. Conjunctiva pink. Ears:  Normal auditory acuity. Neck:  Supple Throat: Oral cavity and pharynx without inflammation, swelling or lesion.  Respiratory: Respirations even and unlabored. Lungs clear to auscultation bilaterally.   No wheezes, crackles, or rhonchi.  Cardiovascular: Normal S1, S2. No MRG. Regular rate and rhythm. No peripheral edema, cyanosis or pallor.  Gastrointestinal:  Soft, nondistended, nontender. No rebound or guarding. Normal bowel sounds. No appreciable masses or hepatomegaly. Rectal:  Not performed.  Msk:  Symmetrical  without gross deformities. Without edema, no deformity or joint abnormality.  Neurologic:  Alert and  oriented x4;  grossly normal neurologically.  Skin:   Dry and intact without significant lesions or rashes. Psychiatric: Demonstrates good judgement and reason without abnormal affect or behaviors.  See HPI for recent labs.  Assessment: 1.  Iron deficiency anemia: Microcytic anemia, last labs at the end of July with a hemoglobin 9.8 and low MCV, has been declining over the past year, patient started oral iron therapy about 2 to 3 weeks ago, no sign of GI blood loss; consider GI blood loss versus iron malabsorption versus other  Plan: 1.  Repeat CBC, CMP and iron studies today. 2.  Continue over-the-counter oral iron supplementation every other day. 3.  Scheduled patient for diagnostic EGD and colonoscopy in the [facility] with Dr. Orvan Falconer for iron deficiency anemia.  Did provide the patient a detailed list of risks for the procedures and she agrees to proceed. Patient is appropriate for endoscopic procedure(s) in the ambulatory ([facility]) setting.  4.  Patient to follow in clinic per recommendations after time of procedures.  Hyacinth Meeker, PA-C [facility] 02/07/2022, 10:17 AM  Cc: Adrian Prince, MD

## 2022-02-07 NOTE — Patient Instructions (Addendum)
If you are age 74 or older, your body mass index should be between 23-30. Your Body mass index is 35.42 kg/m. If this is out of the aforementioned range listed, please consider follow up with your Primary Care Provider.  If you are age 55 or younger, your body mass index should be between 19-25. Your Body mass index is 35.42 kg/m. If this is out of the aformentioned range listed, please consider follow up with your Primary Care Provider.   ________________________________________________________  The [facility] providers would like to encourage you to use [facility] to communicate with providers for non-urgent requests or questions.  Due to long hold times on the telephone, sending your provider a message by [facility] may be a faster and more efficient way to get a response.  Please allow 48 business hours for a response.  Please remember that this is for non-urgent requests.  _______________________________________________________   Bonita Quin have been scheduled for an endoscopy and colonoscopy. Please follow the written instructions given to you at your visit today. Please pick up your prep supplies at the pharmacy within the next 1-3 days. If you use inhalers (even only as needed), please bring them with you on the day of your procedure.   Your provider has requested that you go to the basement level for lab work before leaving today. Press "B" on the elevator. The lab is located at the first door on the left as you exit the elevator.   Continue over the counter iron every other day.  Due to recent changes in healthcare laws, you may see the results of your imaging and laboratory studies on MyChart before your provider has had a chance to review them.  We understand that in some cases there may be results that are confusing or concerning to you. Not all laboratory results come back in the same time frame and the provider may be waiting for multiple results in order to interpret others.  Please give [place] 48  hours in order for your provider to thoroughly review all the results before contacting the office for clarification of your results.    It was a pleasure to see you today!  Thank you for trusting me with your gastrointestinal care!

## 2022-02-08 ENCOUNTER — Other Ambulatory Visit: Payer: Self-pay

## 2022-02-08 DIAGNOSIS — D509 Iron deficiency anemia, unspecified: Secondary | ICD-10-CM

## 2022-02-09 ENCOUNTER — Telehealth: Payer: Self-pay | Admitting: Hematology and Oncology

## 2022-02-09 NOTE — Telephone Encounter (Signed)
Scheduled appt per 9/7 referral. Pt is aware of appt date and time. Pt is aware to arrive 15 mins prior to appt time and to bring and updated insurance card. Pt is aware of appt location.   
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
## 2022-02-18 NOTE — Progress Notes (Signed)
Reviewed and agree with management plans. ? ?Jamesia Linnen L. Suan Pyeatt, MD, MPH  ?

## 2022-02-20 ENCOUNTER — Inpatient Hospital Stay: Payer: Medicare PPO

## 2022-02-20 ENCOUNTER — Inpatient Hospital Stay: Payer: Medicare PPO | Attending: Hematology and Oncology | Admitting: Hematology and Oncology

## 2022-02-20 ENCOUNTER — Other Ambulatory Visit: Payer: Self-pay

## 2022-02-20 ENCOUNTER — Encounter: Payer: Self-pay | Admitting: Hematology and Oncology

## 2022-02-20 VITALS — BP 160/62 | HR 91 | Temp 98.8°F | Resp 18 | Ht 62.0 in | Wt 202.9 lb

## 2022-02-20 DIAGNOSIS — Z79899 Other long term (current) drug therapy: Secondary | ICD-10-CM | POA: Diagnosis not present

## 2022-02-20 DIAGNOSIS — F32A Depression, unspecified: Secondary | ICD-10-CM | POA: Insufficient documentation

## 2022-02-20 DIAGNOSIS — Z803 Family history of malignant neoplasm of breast: Secondary | ICD-10-CM | POA: Insufficient documentation

## 2022-02-20 DIAGNOSIS — D509 Iron deficiency anemia, unspecified: Secondary | ICD-10-CM | POA: Insufficient documentation

## 2022-02-20 DIAGNOSIS — I10 Essential (primary) hypertension: Secondary | ICD-10-CM | POA: Diagnosis not present

## 2022-02-20 NOTE — Progress Notes (Signed)
[place] CONSULT NOTE  Patient Care Team: Reynold Bowen, MD as PCP - General (Endocrinology)  CHIEF COMPLAINTS/PURPOSE OF CONSULTATION:  IDA  ASSESSMENT & PLAN:   This is a very pleasant 76 year old female patient with hypertension, dyslipidemia referred to hematology for evaluation of iron deficiency anemia.  Patient complains of some fatigue, dizziness when she stands up and ice craving.  She however refers to ice craving as constant symptom.  She denies any blood loss per stool.  She has never had a colonoscopy but this has been scheduled to February 27, 2022. Physical examination today without any concerns.  I reviewed her labs which showed mild hypochromic microcytic anemia with severe iron deficiency.  Given mild anemia and since she has been tolerating oral iron well, I recommended that she start with ferrous sulfate 65/325 mg p.o. twice a day for the next 8 weeks and report return to clinic for repeat labs.  If she feels trial of oral iron, we will consider intravenous iron replacement.  At this time her anemia is mild enough to not warrant immediate IV iron replacement.  She is agreeable with this recommendations.  Also encouraged her to stay up-to-date with age-appropriate cancer screening. Thank you for consulting on the care of this patient.  Please do not hesitate to contact us with any additional questions or concerns.   HISTORY OF PRESENTING ILLNESS:  Caroline Martin 76 y.o. female is here because of IDA  This is a very pleasant 76 year old female patient with past medical history significant for hypertension, dyslipidemia, depression, referred to hematology for evaluation of iron deficiency anemia which is new.  Patient has noticed some fatigue, rare positional dizziness especially when she stands up in the past several months.  She however denies any change in bowel habits.  No hematochezia or melena.  She has never had a colonoscopy, this is scheduled for the  end of this month.  No family history of colon cancer however she remembers that her mom died from breast cancer at the age of 76. She otherwise denies any cough, chest pain or shortness of breath. 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   SUI (stress urinary incontinence, female)     SURGICAL HISTORY: Past Surgical History:  Procedure Laterality Date   ANTERIOR CRUCIATE LIGAMENT REPAIR Right 2022   and meniscus repai   AUGMENTATION MAMMAPLASTY     CATARACT EXTRACTION W/ INTRAOCULAR LENS IMPLANT Bilateral    CHOLECYSTECTOMY N/A 07/30/2016   Procedure: LAPAROSCOPIC CHOLECYSTECTOMY WITH INTRAOPERATIVE CHOLANGIOGRAM;  Surgeon: Excell Seltzer, MD;  Location: [facility];  Service: General;  Laterality: N/A;   CYSTOSCOPY  09/20/2011   Procedure: Consuela Mimes;  Surgeon: Reece Packer, MD;  Location: [facility];  Service: Urology;  Laterality: N/A;  [place] sling   PUBOVAGINAL SLING  06/04/2000   PUBOVAGINAL SLING  09/20/2011   Procedure: Gaynelle Arabian;  Surgeon: Reece Packer, MD;  Location: [facility];  Service: Urology;  Laterality: N/A;   TOTAL HIP ARTHROPLASTY Right     SOCIAL HISTORY: Social History   Socioeconomic History   Marital status: Divorced    Spouse name: Not on file  Number of children: 2   Years of education: Not on file   Highest education level: Not on file  Occupational History   Occupation: retired  Tobacco Use   Smoking status: Never   Smokeless tobacco: Never  Vaping Use   Vaping Use: Never used  Substance and Sexual Activity   Alcohol use: Yes    Alcohol/week: 4.0 standard drinks of alcohol    Types: 4 Standard drinks or equivalent per week    Comment: 1-2  per day   Drug use: No   Sexual activity: Not Currently    Birth control/protection: Post-menopausal    Comment: 1st intercourse 71 yo-5 partners  Other Topics Concern   Not on file  Social History Narrative   Not on file   Social Determinants of Health   Financial Resource Strain: Not on file  Food Insecurity: Not on file  Transportation Needs: Not on file  Physical Activity: Not on file  Stress: Not on file  Social Connections: Not on file  Intimate Partner Violence: Not on file    FAMILY HISTORY: Family History  Problem Relation Age of Onset   Breast cancer Mother 51   Heart attack Father    Prostatitis Father    Other Sister        breast fibrosis   Diabetes Son     ALLERGIES:  has No Known Allergies.  MEDICATIONS:  Current Outpatient Medications  Medication Sig Dispense Refill   acetaminophen (TYLENOL) 500 MG tablet Take 1,000 mg by mouth every 6 (six) hours as needed for mild pain.     acidophilus (RISAQUAD) CAPS capsule Take 1 capsule by mouth daily.     B Complex Vitamins (VITAMIN-B COMPLEX PO) Take 1 capsule by mouth daily as needed (vitamins).      calcipotriene-betamethasone (TACLONEX) ointment Apply 1 Application topically at bedtime.     Capsaicin-Menthol (SALONPAS GEL-PATCH HOT EX) Apply 1 patch topically daily as needed (pain).     cholecalciferol (VITAMIN D) 400 units TABS tablet Take 400 Units by mouth daily.     cimetidine (TAGAMET) 300 MG tablet Take 300 mg by mouth daily as needed (reflux).      clobetasol (TEMOVATE) 0.05 % external solution clobetasol 0.05 % scalp solution  APPLY TO SCALP AT BEDTIME X2 WEEKS AS NEEDED FOR FLARE AS DIRECTED     DULoxetine (CYMBALTA) 60 MG capsule Take 60 mg by mouth daily.     ezetimibe-simvastatin (VYTORIN) 10-40 MG per tablet Take 1 tablet by mouth at bedtime.     finasteride (PROSCAR) 5 MG tablet Take 5 mg by mouth daily.     hydrochlorothiazide (HYDRODIURIL) 25 MG tablet Take 25 mg by mouth daily.      hydroxychloroquine (PLAQUENIL) 200 MG tablet Take 200 mg by mouth daily.     IRON, FERROUS SULFATE, PO Take 1 tablet by mouth every other day.     meloxicam (MOBIC) 7.5 MG tablet Take 7.5 mg by mouth daily.     Multiple Vitamin (MULTIVITAMIN) tablet Take 1 tablet by mouth daily.     ramipril (ALTACE) 10 MG tablet Take 10 mg by mouth daily.     No current facility-administered medications for this visit.     PHYSICAL EXAMINATION: ECOG PERFORMANCE STATUS: 0 - Asymptomatic  Vitals:   02/20/22 1018  BP: (!) 160/62  Pulse: 91  Resp: 18  Temp: 98.8 F (37.1 C)  SpO2: 98%   Filed Weights   02/20/22 1018  Weight: 202 lb 14.4 oz (92  kg)    Physical Exam Constitutional:      Appearance: Normal appearance.  Cardiovascular:     Rate and Rhythm: Normal rate and regular rhythm.  Pulmonary:     Effort: Pulmonary effort is normal.     Breath sounds: Normal breath sounds.  Abdominal:     General: Abdomen is flat.     Palpations: Abdomen is soft.  Musculoskeletal:        General: No swelling or tenderness.     Cervical back: Normal range of motion and neck supple. No rigidity.  Lymphadenopathy:     Cervical: No cervical adenopathy.  Skin:    General: Skin is warm and dry.  Neurological:     Mental Status: She is alert.      LABORATORY DATA:  I have reviewed the data as listed Lab Results  Component Value Date   WBC 6.4 02/07/2022   HGB 11.2 (L) 02/07/2022   HCT 36.4 02/07/2022   MCV 67.8 (L) 02/07/2022   PLT 395.0 02/07/2022     Chemistry      Component Value Date/Time   NA 135 02/07/2022 1108   K 3.5 02/07/2022 1108   CL 97 02/07/2022 1108   CO2 29 02/07/2022 1108   BUN 15 02/07/2022 1108   CREATININE 0.91 02/07/2022 1108      Component Value Date/Time   CALCIUM 9.7 02/07/2022 1108   ALKPHOS 78 02/07/2022 1108   AST 22 02/07/2022 1108   ALT 14 02/07/2022 1108   BILITOT 1.3 (H) 02/07/2022 1108       RADIOGRAPHIC STUDIES: I have personally reviewed the  radiological images as listed and agreed with the findings in the report. No results found.  All questions were answered. The patient knows to call the clinic with any problems, questions or concerns. I spent 45 minutes in the care of this patient including H and P, review of records, counseling and coordination of care.     Rachel Moulds, MD 02/20/2022 10:23 AM

## 2022-02-27 ENCOUNTER — Ambulatory Visit (AMBULATORY_SURGERY_CENTER): Payer: Medicare PPO | Admitting: Gastroenterology

## 2022-02-27 ENCOUNTER — Encounter: Payer: Self-pay | Admitting: Gastroenterology

## 2022-02-27 VITALS — BP 157/67 | HR 77 | Temp 98.6°F | Resp 16 | Ht 62.0 in | Wt 202.0 lb

## 2022-02-27 DIAGNOSIS — K648 Other hemorrhoids: Secondary | ICD-10-CM | POA: Diagnosis not present

## 2022-02-27 DIAGNOSIS — D509 Iron deficiency anemia, unspecified: Secondary | ICD-10-CM | POA: Diagnosis not present

## 2022-02-27 DIAGNOSIS — K2950 Unspecified chronic gastritis without bleeding: Secondary | ICD-10-CM | POA: Diagnosis not present

## 2022-02-27 DIAGNOSIS — B9681 Helicobacter pylori [H. pylori] as the cause of diseases classified elsewhere: Secondary | ICD-10-CM

## 2022-02-27 DIAGNOSIS — K2970 Gastritis, unspecified, without bleeding: Secondary | ICD-10-CM

## 2022-02-27 DIAGNOSIS — K31A0 Gastric intestinal metaplasia, unspecified: Secondary | ICD-10-CM | POA: Diagnosis not present

## 2022-02-27 DIAGNOSIS — D72820 Lymphocytosis (symptomatic): Secondary | ICD-10-CM

## 2022-02-27 DIAGNOSIS — K5730 Diverticulosis of large intestine without perforation or abscess without bleeding: Secondary | ICD-10-CM | POA: Diagnosis not present

## 2022-02-27 DIAGNOSIS — K3189 Other diseases of stomach and duodenum: Secondary | ICD-10-CM | POA: Diagnosis not present

## 2022-02-27 MED ORDER — SODIUM CHLORIDE 0.9 % IV SOLN: 500.0000 mL | Freq: Once | INTRAVENOUS | Status: DC

## 2022-02-27 NOTE — Progress Notes (Signed)
Indication for upper endoscopy: Iron deficiency anemia Indication for colonoscopy: Iron deficiency anemia  Labs 02/07/2022 show a hemoglobin of 11.2, MCV 67.8, RDW 20.2, platelets 395, iron 24, ferritin 7.3  Please see the 02/07/2022 office note for complete details.  There is been no significant change in history or physical exam since that time.  The patient remains an appropriate candidate for monitored anesthesia care in the endoscopy center.

## 2022-02-27 NOTE — Progress Notes (Signed)
To pacu, VSS. Report to [email or web address] 

## 2022-02-27 NOTE — Progress Notes (Signed)
Called to room to assist during endoscopic procedure.  Patient ID and intended procedure confirmed with present staff. Received instructions for my participation in the procedure from the performing physician.  

## 2022-02-27 NOTE — Patient Instructions (Addendum)
- Patient has a contact number available for emergencies. The signs and symptoms of potential delayed complications were discussed with the patient. Return to normal activities tomorrow. Written discharge instructions were provided to the Patient. - Resume previous diet. - Emerging evidence supports eating a diet of fruits, vegetables, grains, calcium, and yogurt while reducing red meat and alcohol may reduce the risk of colon cancer. - Continue present medications. - Await pathology results. -Handout on diverticulosis provided   YOU HAD AN ENDOSCOPIC PROCEDURE TODAY AT [facility]:   Refer to the procedure report that was given to you for any specific questions about what was found during the examination.  If the procedure report does not answer your questions, please call your gastroenterologist to clarify.  If you requested that your care partner not be given the details of your procedure findings, then the procedure report has been included in a sealed envelope for you to review at your convenience later.  YOU SHOULD EXPECT: Some feelings of bloating in the abdomen. Passage of more gas than usual.  Walking can help get rid of the air that was put into your GI tract during the procedure and reduce the bloating. If you had a lower endoscopy (such as a colonoscopy or flexible sigmoidoscopy) you may notice spotting of blood in your stool or on the toilet paper. If you underwent a bowel prep for your procedure, you may not have a normal bowel movement for a few days.  Please Note:  You might notice some irritation and congestion in your nose or some drainage.  This is from the oxygen used during your procedure.  There is no need for concern and it should clear up in a day or so.  SYMPTOMS TO REPORT IMMEDIATELY:  Following lower endoscopy (colonoscopy or flexible sigmoidoscopy):  Excessive amounts of blood in the stool  Significant tenderness or worsening of abdominal pains  Swelling  of the abdomen that is new, acute  Fever of 100F or higher  Following upper endoscopy (EGD)  Vomiting of blood or coffee ground material  New chest pain or pain under the shoulder blades  Painful or persistently difficult swallowing  New shortness of breath  Fever of 100F or higher  Black, tarry-looking stools  For urgent or emergent issues, a gastroenterologist can be reached at any hour by calling [phone]. Do not use MyChart messaging for urgent concerns.    DIET:  We do recommend a small meal at first, but then you may proceed to your regular diet.  Drink plenty of fluids but you should avoid alcoholic beverages for 24 hours.  ACTIVITY:  You should plan to take it easy for the rest of today and you should NOT DRIVE or use heavy machinery until tomorrow (because of the sedation medicines used during the test).    FOLLOW UP: Our staff will call the number listed on your records the next business day following your procedure.  We will call around 7:15- 8:00 am to check on you and address any questions or concerns that you may have regarding the information given to you following your procedure. If we do not reach you, we will leave a message.     If any biopsies were taken you will be contacted by phone or by letter within the next 1-3 weeks.  Please call us at [phone] if you have not heard about the biopsies in 3 weeks.    SIGNATURES/CONFIDENTIALITY: You and/or your care partner have signed  paperwork which will be entered into your electronic medical record.  These signatures attest to the fact that that the information above on your After Visit Summary has been reviewed and is understood.  Full responsibility of the confidentiality of this discharge information lies with you and/or your care-partner.

## 2022-02-27 NOTE — Op Note (Signed)
[place] Patient Name: Caroline Martin Procedure Date: 02/27/2022 2:27 PM MRN: [id number] Endoscopist: Thornton Park MD, MD Age: 76 Referring MD:  Date of Birth: 01/14/1946 Gender: Female Account #: [id number] Procedure:                Upper GI endoscopy Indications:              Unexplained iron deficiency anemia Medicines:                Monitored Anesthesia Care Procedure:                Pre-Anesthesia Assessment:                           - Prior to the procedure, a History and Physical                            was performed, and patient medications and                            allergies were reviewed. The patient's tolerance of                            previous anesthesia was also reviewed. The risks                            and benefits of the procedure and the sedation                            options and risks were discussed with the patient.                            All questions were answered, and informed consent                            was obtained. Prior Anticoagulants: The patient has                            taken no previous anticoagulant or antiplatelet                            agents. ASA Grade Assessment: III - A patient with                            severe systemic disease. After reviewing the risks                            and benefits, the patient was deemed in                            satisfactory condition to undergo the procedure.                           After obtaining informed consent, the endoscope was  passed under direct vision. Throughout the                            procedure, the patient's blood pressure, pulse, and                            oxygen saturations were monitored continuously. The                            Endoscope was introduced through the mouth, and                            advanced to the third part of duodenum. The upper                            GI endoscopy was  accomplished without difficulty.                            The patient tolerated the procedure well. Scope In: Scope Out: Findings:                 The examined esophagus was normal.                           Diffuse minimal inflammation characterized by                            erythema, friability and granularity was found in                            the gastric body. Biopsies were taken from the                            antrum, body, and fundus with a cold forceps for                            histology. Estimated blood loss was minimal.                           The examined duodenum was normal. Biopsies were                            taken with a cold forceps for histology. Estimated                            blood loss was minimal.                           The cardia and gastric fundus were normal on                            retroflexion.                           The exam was otherwise without abnormality. Complications:  No immediate complications. Estimated Blood Loss:     Estimated blood loss was minimal. Impression:               - Normal esophagus.                           - Mild gastritis. Biopsied.                           - Normal examined duodenum. Biopsied.                           - The examination was otherwise normal. Recommendation:           - Patient has a contact number available for                            emergencies. The signs and symptoms of potential                            delayed complications were discussed with the                            patient. Return to normal activities tomorrow.                            Written discharge instructions were provided to the                            patient.                           - Resume previous diet.                           - Continue present medications.                           - Await pathology results. Thornton Park MD, MD 02/27/2022 3:16:35 PM This report has  been signed electronically.

## 2022-02-27 NOTE — Op Note (Signed)
[place] Patient Name: Caroline Martin Procedure Date: 02/27/2022 2:23 PM MRN: [id number] Endoscopist: Thornton Park MD, MD Age: 76 Referring MD:  Date of Birth: 01/24/1946 Gender: Female Account #: [id number] Procedure:                Colonoscopy Indications:              Unexplained iron deficiency anemia Medicines:                Monitored Anesthesia Care Procedure:                Pre-Anesthesia Assessment:                           - Prior to the procedure, a History and Physical                            was performed, and patient medications and                            allergies were reviewed. The patient's tolerance of                            previous anesthesia was also reviewed. The risks                            and benefits of the procedure and the sedation                            options and risks were discussed with the patient.                            All questions were answered, and informed consent                            was obtained. Prior Anticoagulants: The patient has                            taken no previous anticoagulant or antiplatelet                            agents. ASA Grade Assessment: II - A patient with                            mild systemic disease. After reviewing the risks                            and benefits, the patient was deemed in                            satisfactory condition to undergo the procedure.                           After obtaining informed consent, the colonoscope  was passed under direct vision. Throughout the                            procedure, the patient's blood pressure, pulse, and                            oxygen saturations were monitored continuously. The                            PCF-HQ190L Colonoscope was introduced through the                            anus and advanced to the 3 cm into the ileum. The                            colonoscopy was  performed with moderate difficulty                            due to multiple diverticula in the colon.                            Successful completion of the procedure was aided by                            withdrawing the scope and replacing with the                            pediatric colonoscope. The patient tolerated the                            procedure well. The quality of the bowel                            preparation was adequate. The terminal ileum,                            ileocecal valve, appendiceal orifice, and rectum                            were photographed. Scope In: 2:51:29 PM Scope Out: 3:12:05 PM Scope Withdrawal Time: 0 hours 14 minutes 9 seconds  Total Procedure Duration: 0 hours 20 minutes 36 seconds  Findings:                 The perianal and digital rectal examinations were                            normal.                           Non-bleeding internal hemorrhoids were found.                           Multiple small and large-mouthed diverticula were  found in the sigmoid colon and descending colon.                           A moderate amount of stool was found in the entire                            colon, making visualization difficult. Small and                            flat lesions could not be excluded in areas of the                            colon.                           The exam was otherwise without abnormality on                            direct and retroflexion views. Complications:            No immediate complications. Estimated Blood Loss:     Estimated blood loss: none. Impression:               - Non-bleeding internal hemorrhoids.                           - Diverticulosis in the sigmoid colon and in the                            descending colon.                           - Stool in the entire examined colon.                           - Source of anemia not identified on this  study. Recommendation:           - Patient has a contact number available for                            emergencies. The signs and symptoms of potential                            delayed complications were discussed with the                            patient. Return to normal activities tomorrow.                            Written discharge instructions were provided to the                            patient.                           - Resume previous diet.                           -  Continue present medications.                           - Emerging evidence supports eating a diet of                            fruits, vegetables, grains, calcium, and yogurt                            while reducing red meat and alcohol may reduce the                            risk of colon cancer. Tressia Danas MD, MD 02/27/2022 3:22:25 PM This report has been signed electronically.

## 2022-02-28 ENCOUNTER — Telehealth: Payer: Self-pay

## 2022-02-28 NOTE — Telephone Encounter (Signed)
  Follow up Call-     02/27/2022    1:51 PM  Call back number  Post procedure Call Back phone  # [phone]  Permission to leave phone message Yes     Patient questions:  Do you have a fever, pain , or abdominal swelling? No. Pain Score  0 *  Have you tolerated food without any problems? Yes.    Have you been able to return to your normal activities? Yes.    Do you have any questions about your discharge instructions: Diet   No. Medications  No. Follow up visit  No.  Do you have questions or concerns about your Care? No.  Actions: * If pain score is 4 or above: No action needed, pain <4.

## 2022-03-09 ENCOUNTER — Other Ambulatory Visit: Payer: Self-pay

## 2022-03-09 DIAGNOSIS — A048 Other specified bacterial intestinal infections: Secondary | ICD-10-CM

## 2022-03-09 MED ORDER — BISMUTH/METRONIDAZ/TETRACYCLIN 140-125-125 MG PO CAPS: 3.0000 | ORAL_CAPSULE | Freq: Three times a day (TID) | ORAL | 0 refills | Status: DC

## 2022-04-09 ENCOUNTER — Telehealth: Payer: Self-pay

## 2022-04-09 NOTE — Telephone Encounter (Signed)
Unable to reach patient, line rings buy. Needs reminder to come in to pick up stool kit & schedule OV.

## 2022-04-11 NOTE — Telephone Encounter (Signed)
OV scheduled with patient for 05/11/22 at 1:30 pm with Dr. Tarri Glenn for follow up H Pylori & to discuss recent bowel changes. She plans to pick up H. Pylori stool kit prior to this OV. She is aware it has been placed at second floor front desk.

## 2022-04-17 ENCOUNTER — Other Ambulatory Visit: Payer: Medicare PPO

## 2022-04-17 ENCOUNTER — Ambulatory Visit: Payer: Medicare PPO | Admitting: Hematology and Oncology

## 2022-04-17 NOTE — Progress Notes (Deleted)
[facility] CONSULT NOTE  Patient Care Team: Adrian Prince, MD as PCP - General (Endocrinology)  CHIEF COMPLAINTS/PURPOSE OF CONSULTATION:  IDA  ASSESSMENT & PLAN:   This is a very pleasant 76 year old female patient with hypertension, dyslipidemia referred to hematology for evaluation of iron deficiency anemia.  Patient complains of some fatigue, dizziness when she stands up and ice craving.  She however refers to ice craving as constant symptom.  She denies any blood loss per stool.  Recent colonoscopy showed multiple small and large mouth diverticula found in sigmoid and descending colon, nonbleeding internal hemorrhoids, no source of anemia. Endoscopy showed diffuse minimal inflammation characterized by erythema friability and granularity found in the gastric body.  Biopsies were taken from antrum body and fundus with a cold forceps for histology.  Estimated blood loss was minimal.  Examined duodenum was normal. During her last visit we discussed about trial of oral iron followed by repeat labs.  She is hence here for follow-up.  HISTORY OF PRESENTING ILLNESS:  Caroline Martin 76 y.o. female is here because of IDA  This is a very pleasant 76 year old female patient with past medical history significant for hypertension, dyslipidemia, depression, referred to hematology for evaluation of iron deficiency anemia which is new.    Interval history She is here for follow-up.  Since last visit she had endoscopy and colonoscopy which were without any clear explanation for the cause of anemia.  She continues on oral iron.   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   SUI (stress urinary incontinence, female)      SURGICAL HISTORY: Past Surgical History:  Procedure Laterality Date   ANTERIOR CRUCIATE LIGAMENT REPAIR Right 2022   and meniscus repai   AUGMENTATION MAMMAPLASTY     CATARACT EXTRACTION W/ INTRAOCULAR LENS IMPLANT Bilateral    CHOLECYSTECTOMY N/A 07/30/2016   Procedure: LAPAROSCOPIC CHOLECYSTECTOMY WITH INTRAOPERATIVE CHOLANGIOGRAM;  Surgeon: Glenna Fellows, MD;  Location: [facility];  Service: General;  Laterality: N/A;   CYSTOSCOPY  09/20/2011   Procedure: Bluford Kaufmann;  Surgeon: Martina Sinner, MD;  Location: [facility];  Service: Urology;  Laterality: N/A;  Monarc sling   PUBOVAGINAL SLING  06/04/2000   PUBOVAGINAL SLING  09/20/2011   Procedure: Leonides Grills;  Surgeon: Martina Sinner, MD;  Location: [facility];  Service: Urology;  Laterality: N/A;   TOTAL HIP ARTHROPLASTY Right     SOCIAL HISTORY: Social History   Socioeconomic History   Marital status: Divorced    Spouse name: Not on file   Number of children: 2   Years of education: Not on file   Highest education level: Not on file  Occupational History   Occupation: retired  Tobacco Use   Smoking status: Never   Smokeless tobacco: Never  Vaping Use   Vaping Use: Never used  Substance and Sexual Activity   Alcohol use: Yes    Alcohol/week: 4.0 standard drinks of alcohol    Types: 4 Standard drinks or equivalent per week    Comment: 1-2 per day   Drug use: No   Sexual activity: Not Currently  Birth control/protection: Post-menopausal    Comment: 1st intercourse 88 yo-5 partners  Other Topics Concern   Not on file  Social History Narrative   Not on file   Social Determinants of Health   Financial Resource Strain: Not on file  Food Insecurity: Not on file  Transportation Needs: Not on file  Physical Activity: Not on file  Stress: Not on file  Social Connections: Not on file  Intimate Partner Violence: Not on file    FAMILY HISTORY: Family History   Problem Relation Age of Onset   Breast cancer Mother 84   Heart attack Father    Prostatitis Father    Other Sister        breast fibrosis   Diabetes Son     ALLERGIES:  has No Known Allergies.  MEDICATIONS:  Current Outpatient Medications  Medication Sig Dispense Refill   acetaminophen (TYLENOL) 500 MG tablet Take 1,000 mg by mouth every 6 (six) hours as needed for mild pain.     acidophilus (RISAQUAD) CAPS capsule Take 1 capsule by mouth daily. (Patient not taking: Reported on 02/27/2022)     B Complex Vitamins (VITAMIN-B COMPLEX PO) Take 1 capsule by mouth daily as needed (vitamins).      Bismuth/Metronidaz/Tetracyclin (PYLERA) 140-125-125 MG CAPS Take 3 capsules by mouth 4 (four) times daily -  before meals and at bedtime for 10 days. 120 capsule 0   calcipotriene-betamethasone (TACLONEX) ointment Apply 1 Application topically at bedtime. (Patient not taking: Reported on 02/27/2022)     Capsaicin-Menthol (SALONPAS GEL-PATCH HOT EX) Apply 1 patch topically daily as needed (pain).     cholecalciferol (VITAMIN D) 400 units TABS tablet Take 400 Units by mouth daily.     cimetidine (TAGAMET) 300 MG tablet Take 300 mg by mouth daily as needed (reflux).      clobetasol (TEMOVATE) 0.05 % external solution clobetasol 0.05 % scalp solution  APPLY TO SCALP AT BEDTIME X2 WEEKS AS NEEDED FOR FLARE AS DIRECTED     DULoxetine (CYMBALTA) 60 MG capsule Take 60 mg by mouth daily.     ezetimibe-simvastatin (VYTORIN) 10-40 MG per tablet Take 1 tablet by mouth at bedtime.     finasteride (PROSCAR) 5 MG tablet Take 5 mg by mouth daily.     hydrochlorothiazide (HYDRODIURIL) 25 MG tablet Take 25 mg by mouth daily.     hydroxychloroquine (PLAQUENIL) 200 MG tablet Take 200 mg by mouth daily.     IRON, FERROUS SULFATE, PO Take 1 tablet by mouth every other day.     meloxicam (MOBIC) 7.5 MG tablet Take 7.5 mg by mouth daily.     Multiple Vitamin (MULTIVITAMIN) tablet Take 1 tablet by mouth daily.      ramipril (ALTACE) 10 MG tablet Take 10 mg by mouth daily.     No current facility-administered medications for this visit.     PHYSICAL EXAMINATION: ECOG PERFORMANCE STATUS: 0 - Asymptomatic  There were no vitals filed for this visit.  There were no vitals filed for this visit.   Physical Exam Constitutional:      Appearance: Normal appearance.  Cardiovascular:     Rate and Rhythm: Normal rate and regular rhythm.  Pulmonary:     Effort: Pulmonary effort is normal.     Breath sounds: Normal breath sounds.  Abdominal:     General: Abdomen is flat.     Palpations: Abdomen is soft.  Musculoskeletal:        General: No swelling or tenderness.  Cervical back: Normal range of motion and neck supple. No rigidity.  Lymphadenopathy:     Cervical: No cervical adenopathy.  Skin:    General: Skin is warm and dry.  Neurological:     Mental Status: She is alert.      LABORATORY DATA:  I have reviewed the data as listed Lab Results  Component Value Date   WBC 6.4 02/07/2022   HGB 11.2 (L) 02/07/2022   HCT 36.4 02/07/2022   MCV 67.8 (L) 02/07/2022   PLT 395.0 02/07/2022     Chemistry      Component Value Date/Time   NA 135 02/07/2022 1108   K 3.5 02/07/2022 1108   CL 97 02/07/2022 1108   CO2 29 02/07/2022 1108   BUN 15 02/07/2022 1108   CREATININE 0.91 02/07/2022 1108      Component Value Date/Time   CALCIUM 9.7 02/07/2022 1108   ALKPHOS 78 02/07/2022 1108   AST 22 02/07/2022 1108   ALT 14 02/07/2022 1108   BILITOT 1.3 (H) 02/07/2022 1108       RADIOGRAPHIC STUDIES: I have personally reviewed the radiological images as listed and agreed with the findings in the report. No results found.  All questions were answered. The patient knows to call the clinic with any problems, questions or concerns. I spent 45 minutes in the care of this patient including H and P, review of records, counseling and coordination of care.     Rachel Moulds, MD 04/17/2022 8:43  AM

## 2022-04-24 ENCOUNTER — Inpatient Hospital Stay: Payer: Medicare PPO | Admitting: Hematology and Oncology

## 2022-04-24 ENCOUNTER — Inpatient Hospital Stay: Payer: Medicare PPO | Attending: Hematology and Oncology

## 2022-04-24 DIAGNOSIS — D509 Iron deficiency anemia, unspecified: Secondary | ICD-10-CM | POA: Insufficient documentation

## 2022-04-24 DIAGNOSIS — Z79899 Other long term (current) drug therapy: Secondary | ICD-10-CM | POA: Insufficient documentation

## 2022-04-24 DIAGNOSIS — I10 Essential (primary) hypertension: Secondary | ICD-10-CM | POA: Insufficient documentation

## 2022-04-24 DIAGNOSIS — Z803 Family history of malignant neoplasm of breast: Secondary | ICD-10-CM | POA: Insufficient documentation

## 2022-04-24 DIAGNOSIS — F32A Depression, unspecified: Secondary | ICD-10-CM | POA: Insufficient documentation

## 2022-04-24 DIAGNOSIS — E785 Hyperlipidemia, unspecified: Secondary | ICD-10-CM | POA: Insufficient documentation

## 2022-04-30 DIAGNOSIS — A048 Other specified bacterial intestinal infections: Secondary | ICD-10-CM | POA: Diagnosis not present

## 2022-05-02 ENCOUNTER — Encounter: Payer: Self-pay | Admitting: Hematology and Oncology

## 2022-05-02 ENCOUNTER — Other Ambulatory Visit: Payer: Self-pay

## 2022-05-02 ENCOUNTER — Inpatient Hospital Stay: Payer: Medicare PPO | Admitting: Hematology and Oncology

## 2022-05-02 ENCOUNTER — Inpatient Hospital Stay: Payer: Medicare PPO

## 2022-05-02 VITALS — BP 185/88 | HR 86 | Temp 97.9°F | Resp 16 | Ht 62.0 in | Wt 202.9 lb

## 2022-05-02 DIAGNOSIS — D509 Iron deficiency anemia, unspecified: Secondary | ICD-10-CM

## 2022-05-02 DIAGNOSIS — F32A Depression, unspecified: Secondary | ICD-10-CM | POA: Diagnosis not present

## 2022-05-02 DIAGNOSIS — Z79899 Other long term (current) drug therapy: Secondary | ICD-10-CM | POA: Diagnosis not present

## 2022-05-02 DIAGNOSIS — I10 Essential (primary) hypertension: Secondary | ICD-10-CM | POA: Diagnosis not present

## 2022-05-02 DIAGNOSIS — Z803 Family history of malignant neoplasm of breast: Secondary | ICD-10-CM | POA: Diagnosis not present

## 2022-05-02 DIAGNOSIS — E785 Hyperlipidemia, unspecified: Secondary | ICD-10-CM | POA: Diagnosis not present

## 2022-05-02 LAB — FERRITIN: Ferritin: 19 ng/mL (ref 11–307)

## 2022-05-02 LAB — CBC WITH DIFFERENTIAL/PLATELET
Abs Immature Granulocytes: 0.01 10*3/uL (ref 0.00–0.07)
Basophils Absolute: 0.1 10*3/uL (ref 0.0–0.1)
Basophils Relative: 1 %
Eosinophils Absolute: 0.1 10*3/uL (ref 0.0–0.5)
Eosinophils Relative: 1 %
HCT: 43.9 % (ref 36.0–46.0)
Hemoglobin: 14.6 g/dL (ref 12.0–15.0)
Immature Granulocytes: 0 %
Lymphocytes Relative: 28 %
Lymphs Abs: 1.8 10*3/uL (ref 0.7–4.0)
MCH: 26.7 pg (ref 26.0–34.0)
MCHC: 33.3 g/dL (ref 30.0–36.0)
MCV: 80.4 fL (ref 80.0–100.0)
Monocytes Absolute: 0.5 10*3/uL (ref 0.1–1.0)
Monocytes Relative: 7 %
Neutro Abs: 4.1 10*3/uL (ref 1.7–7.7)
Neutrophils Relative %: 63 %
Platelets: 249 10*3/uL (ref 150–400)
RBC: 5.46 MIL/uL — ABNORMAL HIGH (ref 3.87–5.11)
RDW: 16.2 % — ABNORMAL HIGH (ref 11.5–15.5)
WBC: 6.5 10*3/uL (ref 4.0–10.5)
nRBC: 0 % (ref 0.0–0.2)

## 2022-05-02 LAB — IRON AND IRON BINDING CAPACITY (CC-WL,HP ONLY)
Iron: 107 ug/dL (ref 28–170)
Saturation Ratios: 22 % (ref 10.4–31.8)
TIBC: 491 ug/dL — ABNORMAL HIGH (ref 250–450)
UIBC: 384 ug/dL (ref 148–442)

## 2022-05-02 NOTE — Progress Notes (Signed)
[facility] CONSULT NOTE  Patient Care Team: Adrian Prince, MD as PCP - General (Endocrinology)  CHIEF COMPLAINTS/PURPOSE OF CONSULTATION:  IDA  ASSESSMENT & PLAN:   This is a very pleasant 76 year old female patient with hypertension, dyslipidemia referred to hematology for evaluation of iron deficiency anemia.   Since last visit, she has noted improvement in fatigue, dizziness and feels better. Physical exam today with no concerns. We reviewed labs today which showed significant improvement in anemia. She had a colonoscopy in sep 2023 which didn't show any evidence of bleeding. At this time since she improved clinically as well as lab wise, she was encouraged to RTC in 6 months or sooner as needed. Thank you for consulting [place] in the care of this patient.  Please do not hesitate to contact us with any additional questions or concerns.   HISTORY OF PRESENTING ILLNESS:  Caroline Martin 76 y.o. female is here because of IDA  This is a very pleasant 76 year old female patient with past medical history significant for hypertension, dyslipidemia, depression, referred to hematology for evaluation of iron deficiency anemia  Since last visit, she has felt quite well. No complaints. Energy is better, dizziness is not an issue anymore. She had a colonoscopy Sep 2023 which didn't show any lesions. 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   SUI (stress urinary incontinence, female)     SURGICAL HISTORY: Past Surgical History:  Procedure Laterality Date   ANTERIOR CRUCIATE LIGAMENT REPAIR Right 2022   and meniscus repai   AUGMENTATION MAMMAPLASTY     CATARACT EXTRACTION W/ INTRAOCULAR LENS IMPLANT Bilateral     CHOLECYSTECTOMY N/A 07/30/2016   Procedure: LAPAROSCOPIC CHOLECYSTECTOMY WITH INTRAOPERATIVE CHOLANGIOGRAM;  Surgeon: Glenna Fellows, MD;  Location: [facility];  Service: General;  Laterality: N/A;   CYSTOSCOPY  09/20/2011   Procedure: Bluford Kaufmann;  Surgeon: Martina Sinner, MD;  Location: [facility];  Service: Urology;  Laterality: N/A;  Monarc sling   PUBOVAGINAL SLING  06/04/2000   PUBOVAGINAL SLING  09/20/2011   Procedure: Leonides Grills;  Surgeon: Martina Sinner, MD;  Location: [facility];  Service: Urology;  Laterality: N/A;   TOTAL HIP ARTHROPLASTY Right     SOCIAL HISTORY: Social History   Socioeconomic History   Marital status: Divorced    Spouse name: Not on file   Number of children: 2   Years of education: Not on file   Highest education level: Not on file  Occupational History   Occupation: retired  Tobacco Use   Smoking status: Never   Smokeless tobacco: Never  Vaping Use   Vaping Use: Never used  Substance and Sexual Activity   Alcohol use: Yes    Alcohol/week: 4.0 standard drinks of alcohol    Types: 4 Standard drinks or equivalent per week    Comment: 1-2 per day   Drug use: No   Sexual activity: Not Currently    Birth control/protection: Post-menopausal    Comment: 1st intercourse 84 yo-5 partners  Other Topics Concern   Not on file  Social History Narrative   Not on file   Social  Determinants of Health   Financial Resource Strain: Not on file  Food Insecurity: Not on file  Transportation Needs: Not on file  Physical Activity: Not on file  Stress: Not on file  Social Connections: Not on file  Intimate Partner Violence: Not on file    FAMILY HISTORY: Family History  Problem Relation Age of Onset   Breast cancer Mother 67   Heart attack Father    Prostatitis Father    Other Sister        breast fibrosis   Diabetes Son     ALLERGIES:  has No Known Allergies.  MEDICATIONS:  Current Outpatient  Medications  Medication Sig Dispense Refill   acetaminophen (TYLENOL) 500 MG tablet Take 1,000 mg by mouth every 6 (six) hours as needed for mild pain.     B Complex Vitamins (VITAMIN-B COMPLEX PO) Take 1 capsule by mouth daily as needed (vitamins).      Capsaicin-Menthol (SALONPAS GEL-PATCH HOT EX) Apply 1 patch topically daily as needed (pain).     cholecalciferol (VITAMIN D) 400 units TABS tablet Take 400 Units by mouth daily.     cimetidine (TAGAMET) 300 MG tablet Take 300 mg by mouth daily as needed (reflux).      DULoxetine (CYMBALTA) 60 MG capsule Take 60 mg by mouth daily.     ezetimibe-simvastatin (VYTORIN) 10-40 MG per tablet Take 1 tablet by mouth at bedtime.     finasteride (PROSCAR) 5 MG tablet Take 5 mg by mouth daily.     hydrochlorothiazide (HYDRODIURIL) 25 MG tablet Take 25 mg by mouth daily.     hydroxychloroquine (PLAQUENIL) 200 MG tablet Take 200 mg by mouth daily.     IRON, FERROUS SULFATE, PO Take 1 tablet by mouth every other day.     Multiple Vitamin (MULTIVITAMIN) tablet Take 1 tablet by mouth daily.     ramipril (ALTACE) 10 MG tablet Take 10 mg by mouth daily.     No current facility-administered medications for this visit.     PHYSICAL EXAMINATION: ECOG PERFORMANCE STATUS: 0 - Asymptomatic  Vitals:   05/02/22 1350  BP: (!) 185/88  Pulse: 86  Resp: 16  Temp: 97.9 F (36.6 C)  SpO2: 100%   Filed Weights   05/02/22 1350  Weight: 202 lb 14.4 oz (92 kg)    Physical Exam Constitutional:      Appearance: Normal appearance.  Cardiovascular:     Rate and Rhythm: Normal rate and regular rhythm.  Pulmonary:     Effort: Pulmonary effort is normal.     Breath sounds: Normal breath sounds.  Abdominal:     General: Abdomen is flat.     Palpations: Abdomen is soft.  Musculoskeletal:        General: No swelling or tenderness.     Cervical back: Normal range of motion and neck supple. No rigidity.  Lymphadenopathy:     Cervical: No cervical adenopathy.   Skin:    General: Skin is warm and dry.  Neurological:     Mental Status: She is alert.      LABORATORY DATA:  I have reviewed the data as listed Lab Results  Component Value Date   WBC 6.5 05/02/2022   HGB 14.6 05/02/2022   HCT 43.9 05/02/2022   MCV 80.4 05/02/2022   PLT 249 05/02/2022     Chemistry      Component Value Date/Time   NA 135 02/07/2022 1108   K 3.5 02/07/2022 1108   CL 97 02/07/2022  1108   CO2 29 02/07/2022 1108   BUN 15 02/07/2022 1108   CREATININE 0.91 02/07/2022 1108      Component Value Date/Time   CALCIUM 9.7 02/07/2022 1108   ALKPHOS 78 02/07/2022 1108   AST 22 02/07/2022 1108   ALT 14 02/07/2022 1108   BILITOT 1.3 (H) 02/07/2022 1108     Labs show significant improvement in anemia.   RADIOGRAPHIC STUDIES: I have personally reviewed the radiological images as listed and agreed with the findings in the report. No results found.  All questions were answered. The patient knows to call the clinic with any problems, questions or concerns. I spent 20 minutes in the care of this patient including H and P, review of records, counseling and coordination of care.   RTC in 6 months.  Rachel Moulds, MD 05/02/2022 2:14 PM

## 2022-05-04 ENCOUNTER — Telehealth: Payer: Self-pay | Admitting: Gastroenterology

## 2022-05-04 NOTE — Telephone Encounter (Signed)
Stool swab negative for H pylori 04/30/22 using [employer] testing.  Please schedule office follow-up with me or Victorino Dike, next available.  Thanks.  KLB

## 2022-05-04 NOTE — Telephone Encounter (Signed)
Spoke with patient regarding MD recommendations. OV previously scheduled & advised she keep it as planned. Pt verbalized all understanding.

## 2022-05-11 ENCOUNTER — Ambulatory Visit: Payer: Medicare PPO | Admitting: Gastroenterology

## 2022-05-29 DIAGNOSIS — L930 Discoid lupus erythematosus: Secondary | ICD-10-CM | POA: Diagnosis not present

## 2022-05-29 DIAGNOSIS — R0981 Nasal congestion: Secondary | ICD-10-CM | POA: Diagnosis not present

## 2022-05-29 DIAGNOSIS — L409 Psoriasis, unspecified: Secondary | ICD-10-CM | POA: Diagnosis not present

## 2022-05-29 DIAGNOSIS — R051 Acute cough: Secondary | ICD-10-CM | POA: Diagnosis not present

## 2022-05-29 DIAGNOSIS — R5383 Other fatigue: Secondary | ICD-10-CM | POA: Diagnosis not present

## 2022-05-29 DIAGNOSIS — Z1152 Encounter for screening for COVID-19: Secondary | ICD-10-CM | POA: Diagnosis not present

## 2022-07-04 DIAGNOSIS — E785 Hyperlipidemia, unspecified: Secondary | ICD-10-CM | POA: Diagnosis not present

## 2022-07-04 DIAGNOSIS — R911 Solitary pulmonary nodule: Secondary | ICD-10-CM | POA: Diagnosis not present

## 2022-07-04 DIAGNOSIS — I10 Essential (primary) hypertension: Secondary | ICD-10-CM | POA: Diagnosis not present

## 2022-07-04 DIAGNOSIS — K760 Fatty (change of) liver, not elsewhere classified: Secondary | ICD-10-CM | POA: Diagnosis not present

## 2022-07-04 DIAGNOSIS — I700 Atherosclerosis of aorta: Secondary | ICD-10-CM | POA: Diagnosis not present

## 2022-07-04 DIAGNOSIS — D500 Iron deficiency anemia secondary to blood loss (chronic): Secondary | ICD-10-CM | POA: Diagnosis not present

## 2022-07-04 DIAGNOSIS — L409 Psoriasis, unspecified: Secondary | ICD-10-CM | POA: Diagnosis not present

## 2022-07-06 ENCOUNTER — Emergency Department (HOSPITAL_COMMUNITY)
Admission: EM | Admit: 2022-07-06 | Discharge: 2022-07-06 | Disposition: A | Discharge status: Home / Self Care | Payer: Medicare PPO | Attending: Emergency Medicine | Admitting: Emergency Medicine

## 2022-07-06 ENCOUNTER — Encounter (HOSPITAL_COMMUNITY): Payer: Self-pay | Admitting: Emergency Medicine

## 2022-07-06 ENCOUNTER — Other Ambulatory Visit: Payer: Self-pay

## 2022-07-06 ENCOUNTER — Emergency Department (HOSPITAL_COMMUNITY): Payer: Medicare PPO

## 2022-07-06 DIAGNOSIS — S4292XA Fracture of left shoulder girdle, part unspecified, initial encounter for closed fracture: Secondary | ICD-10-CM

## 2022-07-06 DIAGNOSIS — S42212A Unspecified displaced fracture of surgical neck of left humerus, initial encounter for closed fracture: Secondary | ICD-10-CM | POA: Diagnosis not present

## 2022-07-06 DIAGNOSIS — S42352A Displaced comminuted fracture of shaft of humerus, left arm, initial encounter for closed fracture: Secondary | ICD-10-CM | POA: Diagnosis not present

## 2022-07-06 DIAGNOSIS — W010XXA Fall on same level from slipping, tripping and stumbling without subsequent striking against object, initial encounter: Secondary | ICD-10-CM | POA: Diagnosis not present

## 2022-07-06 DIAGNOSIS — M25512 Pain in left shoulder: Secondary | ICD-10-CM | POA: Diagnosis not present

## 2022-07-06 DIAGNOSIS — W19XXXA Unspecified fall, initial encounter: Secondary | ICD-10-CM | POA: Diagnosis not present

## 2022-07-06 DIAGNOSIS — Y92096 Garden or yard of other non-institutional residence as the place of occurrence of the external cause: Secondary | ICD-10-CM | POA: Insufficient documentation

## 2022-07-06 DIAGNOSIS — M25519 Pain in unspecified shoulder: Secondary | ICD-10-CM | POA: Diagnosis not present

## 2022-07-06 DIAGNOSIS — S42252A Displaced fracture of greater tuberosity of left humerus, initial encounter for closed fracture: Secondary | ICD-10-CM | POA: Diagnosis not present

## 2022-07-06 MED ORDER — FENTANYL CITRATE PF 50 MCG/ML IJ SOSY
50.0000 ug | PREFILLED_SYRINGE | Freq: Once | INTRAMUSCULAR | Status: AC
  Administered 2022-07-06: 50 ug via INTRAVENOUS
  Filled 2022-07-06: qty 1

## 2022-07-06 MED ORDER — OXYCODONE-ACETAMINOPHEN 5-325 MG PO TABS: 1.0000 | ORAL_TABLET | Freq: Four times a day (QID) | ORAL | 0 refills | Status: DC | PRN

## 2022-07-06 MED ORDER — ONDANSETRON HCL 4 MG/2ML IJ SOLN
4.0000 mg | Freq: Once | INTRAMUSCULAR | Status: AC
  Administered 2022-07-06: 4 mg via INTRAVENOUS
  Filled 2022-07-06: qty 2

## 2022-07-06 MED ORDER — MORPHINE SULFATE (PF) 4 MG/ML IV SOLN
4.0000 mg | Freq: Once | INTRAVENOUS | Status: AC
  Administered 2022-07-06: 4 mg via INTRAVENOUS
  Filled 2022-07-06: qty 1

## 2022-07-06 MED ORDER — METHOCARBAMOL 500 MG PO TABS: 500.0000 mg | ORAL_TABLET | Freq: Three times a day (TID) | ORAL | 0 refills | Status: DC | PRN

## 2022-07-06 NOTE — ED Triage Notes (Signed)
Pt to ER via EMS from home after tripping and falling landing on left shoulder.  Pt denies hitting head, denies LOC, denies other injury.

## 2022-07-06 NOTE — Discharge Instructions (Signed)
Contact a health care provider if: °You have any new pain, swelling, or bruising. °Your pain, swelling, and bruising do not improve. °Your cast, splint, or sling becomes loose or damaged. °Get help right away if: °Your skin or fingers on your injured arm turn blue or gray. °Your arm feels cold or numb. °You have severe pain in your injured arm. °

## 2022-07-06 NOTE — Progress Notes (Signed)
Orthopedic Tech Progress Note Patient Details:  Caroline Martin 04-22-1946 [id number]  Ortho Devices Type of Ortho Device: Shoulder immobilizer Ortho Device/Splint Location: LUE Ortho Device/Splint Interventions: Ordered, Application, Adjustment   Post Interventions Patient Tolerated: Well Instructions Provided: Care of device, Adjustment of device  [facility] 07/06/2022, 2:53 PM

## 2022-07-06 NOTE — ED Provider Notes (Signed)
[facility] Provider Note   CSN: [id number] Arrival date & time: 07/06/22  1208     History  Chief Complaint  Patient presents with   Fall   Arm Pain    Caroline Martin is a 77 y.o. female   77 year old female who presents emergency department with mechanical fall.  Patient was getting ready to take her garbage out when she tripped over some edging in her yard.  She fell directly onto her left shoulder.  She denies hitting her head.  She denies neck pain.  She had immediate severe pain in her left arm and shoulder.  She is right-hand dominant.  She denies any numbness or tingling.  She was unable to stand up because she could not use both of her arms.  She called EMS who transported her here.  She is given fentanyl prior to arrival.  The history is provided by the patient and the EMS personnel. No language interpreter was used.  Shoulder Injury This is a new problem. The current episode started less than 1 hour ago. The problem has not changed since onset.Pertinent negatives include no chest pain, no abdominal pain, no headaches and no shortness of breath. Exacerbated by: movement. Relieved by: immobilization.       Home Medications Prior to Admission medications   Medication Sig Start Date End Date Taking? Authorizing Provider  acetaminophen (TYLENOL) 500 MG tablet Take 1,000 mg by mouth every 6 (six) hours as needed for mild pain.    [provider]  B Complex Vitamins (VITAMIN-B COMPLEX PO) Take 1 capsule by mouth daily as needed (vitamins).     [provider]  Capsaicin-Menthol (SALONPAS GEL-PATCH HOT EX) Apply 1 patch topically daily as needed (pain).    [provider]  cholecalciferol (VITAMIN D) 400 units TABS tablet Take 400 Units by mouth daily.    [provider]  cimetidine (TAGAMET) 300 MG tablet Take 300 mg by mouth daily as needed (reflux).     [provider]  DULoxetine  (CYMBALTA) 60 MG capsule Take 60 mg by mouth daily.    [provider]  ezetimibe-simvastatin (VYTORIN) 10-40 MG per tablet Take 1 tablet by mouth at bedtime.    [provider]  finasteride (PROSCAR) 5 MG tablet Take 5 mg by mouth daily. 01/12/20   [provider]  hydrochlorothiazide (HYDRODIURIL) 25 MG tablet Take 25 mg by mouth daily.    [provider]  hydroxychloroquine (PLAQUENIL) 200 MG tablet Take 200 mg by mouth daily. 02/01/20   [provider]  IRON, FERROUS SULFATE, PO Take 1 tablet by mouth every other day.    [provider]  Multiple Vitamin (MULTIVITAMIN) tablet Take 1 tablet by mouth daily.    [provider]  ramipril (ALTACE) 10 MG tablet Take 10 mg by mouth daily.    [provider]      Allergies    Patient has no known allergies.    Review of Systems   Review of Systems  Respiratory:  Negative for shortness of breath.   Cardiovascular:  Negative for chest pain.  Gastrointestinal:  Negative for abdominal pain.  Neurological:  Negative for headaches.    Physical Exam Updated Vital Signs BP (!) 190/108   Temp (!) 97.3 F (36.3 C)   Resp 18   Ht 5' 5"$  (1.651 m)   Wt 89.8 kg   SpO2 99%   BMI 32.95 kg/m  Physical Exam  Vitals and nursing note reviewed.  Constitutional:      General: She is not in acute distress.    Appearance: She is well-developed. She is not diaphoretic.  HENT:     Head: Normocephalic and atraumatic.     Right Ear: External ear normal.     Left Ear: External ear normal.     Nose: Nose normal.     Mouth/Throat:     Mouth: Mucous membranes are moist.  Eyes:     General: No scleral icterus.    Conjunctiva/sclera: Conjunctivae normal.  Cardiovascular:     Rate and Rhythm: Normal rate and regular rhythm.     Heart sounds: Normal heart sounds. No murmur heard.    No friction rub. No gallop.  Pulmonary:     Effort: Pulmonary effort is normal. No respiratory distress.      Breath sounds: Normal breath sounds.  Abdominal:     General: Bowel sounds are normal. There is no distension.     Palpations: Abdomen is soft. There is no mass.     Tenderness: There is no abdominal tenderness. There is no guarding.  Musculoskeletal:     Cervical back: Normal range of motion.     Comments: Patient arm is sitting in supported position on the left.  She has no obvious deformity but there is some significant swelling over the deltoid and anterior left chest wall.  She is tender to palpation along the proximal and midshaft of the left humerus.  No pain with ipsilateral movement of the wrist.  She has no abnormal sensation, normal strong and equal grip strengths.  Skin:    General: Skin is warm and dry.  Neurological:     Mental Status: She is alert and oriented to person, place, and time.  Psychiatric:        Behavior: Behavior normal.     ED Results / Procedures / Treatments   Labs (all labs ordered are listed, but only abnormal results are displayed) Labs Reviewed - No data to display  EKG None  Radiology DG Humerus Left  Result Date: 07/06/2022 CLINICAL DATA:  Fall this morning with left shoulder and upper extremity pain EXAM: LEFT HUMERUS - 2+ VIEW COMPARISON:  None Available. FINDINGS: Comminuted proximal left humerus fracture involving the surgical neck and greater tuberosity with mild impaction and approximately 1 cm anteromedial displacement of the dominant distal fracture fragment. No frank dislocation of the humeral head fragment at the glenohumeral joint. No evidence of left elbow dislocation. No focal osseous lesions. No radiopaque foreign bodies. IMPRESSION: Comminuted proximal left humerus fracture as detailed. Electronically Signed   By: Ilona Sorrel M.D.   On: 07/06/2022 13:36   DG Shoulder Left  Result Date: 07/06/2022 CLINICAL DATA:  Fall this morning with left shoulder. EXAM: LEFT SHOULDER - 2+ VIEW COMPARISON:  None Available. FINDINGS: Comminuted  impacted proximal left humerus fracture involving the greater tuberosity and surgical neck with approximately 1 cm anteromedial displacement of the dominant distal fracture fragment. No frank dislocation of the humeral head fragment at the glenohumeral joint. No evidence of acromioclavicular separation. No suspicious focal osseous lesions. Mild AC joint osteoarthritis. No radiopaque foreign bodies or pathologic soft tissue calcifications. IMPRESSION: Comminuted impacted displaced proximal left humerus fracture as detailed. Electronically Signed   By: Ilona Sorrel M.D.   On: 07/06/2022 13:35    Procedures Procedures    Medications Ordered in ED Medications  fentaNYL (SUBLIMAZE) injection 50 mcg (50 mcg Intravenous Given 07/06/22 1338)  ondansetron [facility]) injection 4 mg (4 mg Intravenous Given 07/06/22 1338)    ED Course/ Medical Decision Making/ A&P Clinical Course as of 07/06/22 1346  Fri Jul 06, 2022  1345 DG Humerus Left [AH]  1345 DG Shoulder Left Visualized and interpreted image of the left humerus and shoulder which shows comminuted left proximal humerus fracture.  I have ordered a left sided holder immobilizer. [AH]    Clinical Course User Index [AH] Margarita Mail, PA-C                             Medical Decision Making 77 year old female who presents emergency department chief complaint of left upper extremity pain after mechanical fall.  I visualized and interpreted portable left humerus and shoulder x-ray which shows proximal comminuted humerus fracture.  I discussed findings of the imaging with Dr. Pearline Cables as well as the patient.  And of care is to place the patient in sling immobilizer, treat with pain medications and have her follow closely in the outpatient setting with therapeutics. Comfortable with this plan.  Pain is controlled at this time. PDMP reviewed during this encounter. Discussed return precautions and outpatient follow-up.  Amount and/or Complexity of Data  Reviewed Radiology: ordered. Decision-making details documented in ED Course.  Risk Prescription drug management.           Final Clinical Impression(s) / ED Diagnoses Final diagnoses:  Closed fracture of left shoulder, initial encounter    Rx / DC Orders ED Discharge Orders     None         Margarita Mail, PA-C 07/06/22 [place], DO 07/13/22 [phone]

## 2022-07-10 ENCOUNTER — Encounter (HOSPITAL_COMMUNITY): Payer: Self-pay | Admitting: Orthopedic Surgery

## 2022-07-10 ENCOUNTER — Other Ambulatory Visit: Payer: Self-pay | Admitting: Orthopedic Surgery

## 2022-07-10 ENCOUNTER — Other Ambulatory Visit: Payer: Self-pay

## 2022-07-10 ENCOUNTER — Ambulatory Visit
Admission: RE | Admit: 2022-07-10 | Discharge: 2022-07-10 | Disposition: A | Discharge status: Home / Self Care | Payer: Medicare PPO | Source: Ambulatory Visit | Attending: Orthopedic Surgery | Admitting: Orthopedic Surgery

## 2022-07-10 DIAGNOSIS — M25512 Pain in left shoulder: Secondary | ICD-10-CM | POA: Diagnosis not present

## 2022-07-10 DIAGNOSIS — S42212A Unspecified displaced fracture of surgical neck of left humerus, initial encounter for closed fracture: Secondary | ICD-10-CM | POA: Diagnosis not present

## 2022-07-10 DIAGNOSIS — S42202A Unspecified fracture of upper end of left humerus, initial encounter for closed fracture: Secondary | ICD-10-CM | POA: Diagnosis not present

## 2022-07-10 DIAGNOSIS — S42272A Torus fracture of upper end of left humerus, initial encounter for closed fracture: Secondary | ICD-10-CM

## 2022-07-10 DIAGNOSIS — M79632 Pain in left forearm: Secondary | ICD-10-CM | POA: Diagnosis not present

## 2022-07-10 NOTE — Progress Notes (Addendum)
COVID Vaccine Completed:  Yes   Date of COVID positive in last 90 days:  No  PCP - Reynold Bowen, MD Cardiologist - N/A  Chest x-ray - N/A EKG - Day of surgery Stress Test -  N/A ECHO -  N/A Cardiac Cath -  N/A Pacemaker/ICD device last checked: Spinal Cord Stimulator: N/A  Bowel Prep -  N/A  Sleep Study -  N/A CPAP -   Fasting Blood Sugar -  N/A Checks Blood Sugar _____ times a day  Last dose of GLP1 agonist-  N/A GLP1 instructions:  N/A   Last dose of SGLT-2 inhibitors-  N/A SGLT-2 instructions: N/A   Blood Thinner Instructions: Aspirin Instructions: Last Dose:  Activity level:  Can go up a flight of stairs and perform activities of daily living without stopping and without symptoms of chest pain or shortness of breath.  Able to exercise without symptoms (prior to shoulder injury no difficulties)  Anesthesia review:  N/A  Patient denies shortness of breath, fever, cough and chest pain at PAT appointment (completed over the phone)  Patient verbalized understanding of instructions that were given to them at the PAT appointment. Patient was also instructed that they will need to review over the PAT instructions again at home before surgery.

## 2022-07-10 NOTE — H&P (Signed)
Patient's anticipated LOS is less than 2 midnights, meeting these requirements: - Younger than 1 - Lives within 1 hour of care - Has a competent adult at home to recover with post-op recover - NO history of  - Chronic pain requiring opiods  - Diabetes  - Coronary Artery Disease  - Heart failure  - Heart attack  - Stroke  - DVT/VTE  - Cardiac arrhythmia  - Respiratory Failure/COPD  - Renal failure  - Anemia  - Advanced Liver disease     Caroline Martin is an 77 y.o. female.    Chief Complaint: left shoulder pain  HPI: Pt is a 77 y.o. female complaining of left shoulder pain after recent fall causing a fracture Pain had continually increased since the beginning. X-rays in the clinic show displaced fracture of left proximal humerus. Pt has tried various conservative treatments which have failed to alleviate their symptoms. Various options are discussed with the patient. Risks, benefits and expectations were discussed with the patient. Patient understand the risks, benefits and expectations and wishes to proceed with surgery.   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   SUI (stress urinary incontinence, female)     PSH: Past Surgical History:  Procedure Laterality Date   ANTERIOR CRUCIATE LIGAMENT REPAIR Right 2022   and meniscus repai   AUGMENTATION MAMMAPLASTY     CATARACT EXTRACTION W/ INTRAOCULAR LENS IMPLANT Bilateral    CHOLECYSTECTOMY N/A 07/30/2016   Procedure: LAPAROSCOPIC CHOLECYSTECTOMY WITH INTRAOPERATIVE CHOLANGIOGRAM;  Surgeon: Excell Seltzer, MD;  Location: [facility];  Service: General;  Laterality: N/A;   CYSTOSCOPY  09/20/2011   Procedure: Consuela Mimes;  Surgeon: Reece Packer, MD;  [facility];  Service: Urology;  Laterality: N/A;  [place] sling   PUBOVAGINAL SLING  06/04/2000   PUBOVAGINAL SLING  09/20/2011   Procedure: Gaynelle Arabian;  Surgeon: Reece Packer, MD;  Location: [facility];  Service: Urology;  Laterality: N/A;   TOTAL HIP ARTHROPLASTY Right     Social History:  reports that she has never smoked. She has never used smokeless tobacco. She reports that she does not currently use alcohol. She reports that she does not use drugs. BMI: Estimated body mass index is 30.32 kg/m as calculated from the following:   Height as of this encounter: 5' 5.5" (1.664 m).   Weight as of this encounter: 83.9 kg.  Lab Results  Component Value Date   ALBUMIN 4.5 02/07/2022   Diabetes: Patient does not have a diagnosis of diabetes.     Smoking Status:   reports that she has never smoked. She has never used smokeless tobacco.    Allergies:  No Known Allergies  Medications: No current facility-administered medications for this encounter.   Current Outpatient Medications  Medication Sig Dispense Refill   acetaminophen (TYLENOL) 500 MG tablet Take 1,000 mg by mouth every 6 (six) hours as needed for mild pain.     B Complex Vitamins (VITAMIN-B COMPLEX PO) Take 1 capsule by mouth daily.     calcipotriene-betamethasone (TACLONEX) ointment Apply 1 Application topically 2 (two) times daily as needed (psoriasis).  cholecalciferol (VITAMIN D) 400 units TABS tablet Take 400 Units by mouth daily.     cimetidine (TAGAMET) 200 MG tablet Take 200 mg by mouth daily as needed (heartburn).     DULoxetine (CYMBALTA) 60 MG capsule Take 60 mg by mouth daily.     ezetimibe-simvastatin (VYTORIN) 10-40 MG per tablet Take 1 tablet by mouth at bedtime.     Ferrous Sulfate (SLOW RELEASE IRON PO) Take 1 tablet by mouth every other day.     finasteride (PROSCAR) 5 MG tablet Take 5 mg by mouth daily.     hydrochlorothiazide (HYDRODIURIL) 25 MG tablet Take 25 mg  by mouth daily.     hydroxychloroquine (PLAQUENIL) 200 MG tablet Take 200 mg by mouth daily.     Multiple Vitamin (MULTIVITAMIN) tablet Take 1 tablet by mouth daily.     oxyCODONE-acetaminophen (PERCOCET/ROXICET) 5-325 MG tablet Take 1-2 tablets by mouth every 6 (six) hours as needed for severe pain. 20 tablet 0   ramipril (ALTACE) 10 MG tablet Take 10 mg by mouth daily.     ciprofloxacin (CIPRO) 500 MG tablet Take 500 mg by mouth 2 (two) times daily.     HYDROcodone-acetaminophen (NORCO/VICODIN) 5-325 MG tablet Take 1 tablet by mouth 4 (four) times daily as needed.     methocarbamol (ROBAXIN) 500 MG tablet Take 1 tablet (500 mg total) by mouth 3 (three) times daily as needed for muscle spasms. (Patient not taking: Reported on 07/10/2022) 21 tablet 0    No results found for this or any previous visit (from the past 48 hour(s)). No results found.  ROS: Pain with rom of the left upper extremity  Physical Exam: Alert and oriented 77 y.o. female in no acute distress Cranial nerves 2-12 intact Cervical spine: full rom with no tenderness, nv intact distally Chest: active breath sounds bilaterally, no wheeze rhonchi or rales Heart: regular rate and rhythm, no murmur Abd: non tender non distended with active bowel sounds Hip is stable with rom  Left shoulder painful rom with weakness No signs of open injury Mild edema and early ecchymosis Nv intact distally   Assessment/Plan Assessment: left proximal humerus fracture with displacement  Plan:  Patient will undergo a left proximal humerus ORIF by Dr. Veverly Fells at [facility] Risks benefits and expectations were discussed with the patient. Patient understand risks, benefits and expectations and wishes to proceed. Preoperative templating of the joint replacement has been completed, documented, and submitted to the Operating Room personnel in order to optimize intra-operative equipment management.   Merla Riches PA-C, MPAS [facility] is  now [employer] [address], [employer], [place], [place] [address] Phone: [phone] [email or web address] Facebook  [employer]

## 2022-07-10 NOTE — Progress Notes (Signed)
Surgery orders requested via Epic inbox. °

## 2022-07-11 ENCOUNTER — Ambulatory Visit (HOSPITAL_COMMUNITY): Payer: Medicare PPO | Admitting: Certified Registered"

## 2022-07-11 ENCOUNTER — Ambulatory Visit (HOSPITAL_BASED_OUTPATIENT_CLINIC_OR_DEPARTMENT_OTHER): Payer: Medicare PPO | Admitting: Certified Registered"

## 2022-07-11 ENCOUNTER — Encounter (HOSPITAL_COMMUNITY): Payer: Self-pay | Admitting: Orthopedic Surgery

## 2022-07-11 ENCOUNTER — Encounter (HOSPITAL_COMMUNITY)
Admission: RE | Disposition: A | Discharge status: Home / Self Care | Payer: Self-pay | Source: Home / Self Care | Attending: Orthopedic Surgery

## 2022-07-11 ENCOUNTER — Observation Stay (HOSPITAL_COMMUNITY)
Admission: RE | Admit: 2022-07-11 | Discharge: 2022-07-12 | Disposition: A | Discharge status: Home / Self Care | Payer: Medicare PPO | Attending: Orthopedic Surgery | Admitting: Orthopedic Surgery

## 2022-07-11 ENCOUNTER — Other Ambulatory Visit: Payer: Self-pay

## 2022-07-11 DIAGNOSIS — Z96641 Presence of right artificial hip joint: Secondary | ICD-10-CM | POA: Diagnosis not present

## 2022-07-11 DIAGNOSIS — S42292D Other displaced fracture of upper end of left humerus, subsequent encounter for fracture with routine healing: Secondary | ICD-10-CM

## 2022-07-11 DIAGNOSIS — S42352A Displaced comminuted fracture of shaft of humerus, left arm, initial encounter for closed fracture: Secondary | ICD-10-CM | POA: Diagnosis not present

## 2022-07-11 DIAGNOSIS — I10 Essential (primary) hypertension: Secondary | ICD-10-CM | POA: Diagnosis not present

## 2022-07-11 DIAGNOSIS — X58XXXA Exposure to other specified factors, initial encounter: Secondary | ICD-10-CM | POA: Diagnosis not present

## 2022-07-11 DIAGNOSIS — G8918 Other acute postprocedural pain: Secondary | ICD-10-CM | POA: Diagnosis not present

## 2022-07-11 DIAGNOSIS — S42202A Unspecified fracture of upper end of left humerus, initial encounter for closed fracture: Secondary | ICD-10-CM

## 2022-07-11 DIAGNOSIS — D649 Anemia, unspecified: Secondary | ICD-10-CM

## 2022-07-11 DIAGNOSIS — Z79899 Other long term (current) drug therapy: Secondary | ICD-10-CM | POA: Diagnosis not present

## 2022-07-11 HISTORY — PX: ORIF HUMERUS FRACTURE: SHX2126

## 2022-07-11 HISTORY — DX: Unspecified osteoarthritis, unspecified site: M19.90

## 2022-07-11 LAB — BASIC METABOLIC PANEL
Anion gap: 14 (ref 5–15)
BUN: 13 mg/dL (ref 8–23)
CO2: 29 mmol/L (ref 22–32)
Calcium: 9.2 mg/dL (ref 8.9–10.3)
Chloride: 89 mmol/L — ABNORMAL LOW (ref 98–111)
Creatinine, Ser: 0.72 mg/dL (ref 0.44–1.00)
GFR, Estimated: >60 mL/min (ref >60)
Glucose, Bld: 105 mg/dL — ABNORMAL HIGH (ref 70–99)
Potassium: 3.4 mmol/L — ABNORMAL LOW (ref 3.5–5.1)
Sodium: 132 mmol/L — ABNORMAL LOW (ref 135–145)

## 2022-07-11 LAB — CBC
HCT: 37.1 % (ref 36.0–46.0)
Hemoglobin: 12.3 g/dL (ref 12.0–15.0)
MCH: 28 pg (ref 26.0–34.0)
MCHC: 33.2 g/dL (ref 30.0–36.0)
MCV: 84.3 fL (ref 80.0–100.0)
Platelets: 312 10*3/uL (ref 150–400)
RBC: 4.4 MIL/uL (ref 3.87–5.11)
RDW: 14.3 % (ref 11.5–15.5)
WBC: 8.8 10*3/uL (ref 4.0–10.5)
nRBC: 0 % (ref 0.0–0.2)

## 2022-07-11 SURGERY — OPEN REDUCTION INTERNAL FIXATION (ORIF) PROXIMAL HUMERUS FRACTURE
Anesthesia: General | Laterality: Left

## 2022-07-11 MED ORDER — EZETIMIBE 10 MG PO TABS
10.0000 mg | ORAL_TABLET | Freq: Every day | ORAL | Status: DC
  Administered 2022-07-11: 10 mg via ORAL
  Filled 2022-07-11: qty 1

## 2022-07-11 MED ORDER — OXYCODONE HCL 5 MG PO TABS: 5.0000 mg | ORAL_TABLET | Freq: Once | ORAL | Status: DC | PRN

## 2022-07-11 MED ORDER — CALCIPOTRIENE-BETAMETH DIPROP 0.005-0.064 % EX OINT: 1.0000 | TOPICAL_OINTMENT | Freq: Two times a day (BID) | CUTANEOUS | Status: DC | PRN

## 2022-07-11 MED ORDER — B COMPLEX-C PO TABS
1.0000 | ORAL_TABLET | Freq: Every day | ORAL | Status: DC
  Administered 2022-07-12: 1 via ORAL
  Filled 2022-07-11: qty 1

## 2022-07-11 MED ORDER — METHOCARBAMOL 500 MG IVPB - SIMPLE MED: 500.0000 mg | Freq: Four times a day (QID) | INTRAVENOUS | Status: DC | PRN

## 2022-07-11 MED ORDER — OXYCODONE HCL 5 MG/5ML PO SOLN: 5.0000 mg | Freq: Once | ORAL | Status: DC | PRN

## 2022-07-11 MED ORDER — ONDANSETRON HCL 4 MG/2ML IJ SOLN: 4.0000 mg | Freq: Once | INTRAMUSCULAR | Status: DC | PRN

## 2022-07-11 MED ORDER — MORPHINE SULFATE (PF) 2 MG/ML IV SOLN: 0.5000 mg | INTRAVENOUS | Status: DC | PRN

## 2022-07-11 MED ORDER — CIPROFLOXACIN HCL 500 MG PO TABS
500.0000 mg | ORAL_TABLET | Freq: Two times a day (BID) | ORAL | Status: DC
  Administered 2022-07-11 – 2022-07-12 (×2): 500 mg via ORAL
  Filled 2022-07-11 (×2): qty 1

## 2022-07-11 MED ORDER — FERROUS SULFATE 325 (65 FE) MG PO TABS
325.0000 mg | ORAL_TABLET | ORAL | Status: DC
  Administered 2022-07-12: 325 mg via ORAL
  Filled 2022-07-11: qty 1

## 2022-07-11 MED ORDER — ACETAMINOPHEN 500 MG PO TABS: 1000.0000 mg | ORAL_TABLET | Freq: Four times a day (QID) | ORAL | Status: DC | PRN

## 2022-07-11 MED ORDER — CHOLECALCIFEROL 10 MCG (400 UNIT) PO TABS
400.0000 [IU] | ORAL_TABLET | Freq: Every day | ORAL | Status: DC
  Administered 2022-07-11 – 2022-07-12 (×2): 400 [IU] via ORAL
  Filled 2022-07-11 (×2): qty 1

## 2022-07-11 MED ORDER — BUPIVACAINE-EPINEPHRINE 0.5% -1:200000 IJ SOLN
INTRAMUSCULAR | Status: DC | PRN
  Administered 2022-07-11: 16 mL

## 2022-07-11 MED ORDER — EPINEPHRINE PF 1 MG/ML IJ SOLN
INTRAMUSCULAR | Status: AC
  Filled 2022-07-11: qty 1

## 2022-07-11 MED ORDER — STERILE WATER FOR IRRIGATION IR SOLN
Status: DC | PRN
  Administered 2022-07-11: 2000 mL

## 2022-07-11 MED ORDER — PHENYLEPHRINE HCL (PRESSORS) 10 MG/ML IV SOLN
INTRAVENOUS | Status: AC
  Filled 2022-07-11: qty 1

## 2022-07-11 MED ORDER — METOCLOPRAMIDE HCL 5 MG PO TABS: 5.0000 mg | ORAL_TABLET | Freq: Three times a day (TID) | ORAL | Status: DC | PRN

## 2022-07-11 MED ORDER — PROPOFOL 10 MG/ML IV BOLUS
INTRAVENOUS | Status: AC
  Filled 2022-07-11: qty 20

## 2022-07-11 MED ORDER — HYDROCHLOROTHIAZIDE 25 MG PO TABS
25.0000 mg | ORAL_TABLET | Freq: Every day | ORAL | Status: DC
  Administered 2022-07-11 – 2022-07-12 (×2): 25 mg via ORAL
  Filled 2022-07-11 (×2): qty 1

## 2022-07-11 MED ORDER — RAMIPRIL 5 MG PO CAPS
10.0000 mg | ORAL_CAPSULE | Freq: Every day | ORAL | Status: DC
  Administered 2022-07-11 – 2022-07-12 (×2): 10 mg via ORAL
  Filled 2022-07-11 (×2): qty 2

## 2022-07-11 MED ORDER — FENTANYL CITRATE PF 50 MCG/ML IJ SOSY: 25.0000 ug | PREFILLED_SYRINGE | INTRAMUSCULAR | Status: DC | PRN

## 2022-07-11 MED ORDER — LACTATED RINGERS IV SOLN: INTRAVENOUS | Status: DC

## 2022-07-11 MED ORDER — HYDROXYCHLOROQUINE SULFATE 200 MG PO TABS
200.0000 mg | ORAL_TABLET | Freq: Every day | ORAL | Status: DC
  Administered 2022-07-11 – 2022-07-12 (×2): 200 mg via ORAL
  Filled 2022-07-11 (×2): qty 1

## 2022-07-11 MED ORDER — ONDANSETRON HCL 4 MG/2ML IJ SOLN
INTRAMUSCULAR | Status: AC
  Filled 2022-07-11: qty 2

## 2022-07-11 MED ORDER — BACITRACIN ZINC 500 UNIT/GM EX OINT
TOPICAL_OINTMENT | CUTANEOUS | Status: AC
  Filled 2022-07-11: qty 28.35

## 2022-07-11 MED ORDER — DULOXETINE HCL 60 MG PO CPEP
60.0000 mg | ORAL_CAPSULE | Freq: Every day | ORAL | Status: DC
  Administered 2022-07-11 – 2022-07-12 (×2): 60 mg via ORAL
  Filled 2022-07-11 (×2): qty 1

## 2022-07-11 MED ORDER — SIMVASTATIN 40 MG PO TABS
40.0000 mg | ORAL_TABLET | Freq: Every day | ORAL | Status: DC
  Administered 2022-07-11: 40 mg via ORAL
  Filled 2022-07-11: qty 1

## 2022-07-11 MED ORDER — LIDOCAINE HCL (PF) 2 % IJ SOLN
INTRAMUSCULAR | Status: AC
  Filled 2022-07-11: qty 5

## 2022-07-11 MED ORDER — ROCURONIUM BROMIDE 10 MG/ML (PF) SYRINGE
PREFILLED_SYRINGE | INTRAVENOUS | Status: AC
  Filled 2022-07-11: qty 10

## 2022-07-11 MED ORDER — CHLORHEXIDINE GLUCONATE 0.12 % MT SOLN
15.0000 mL | Freq: Once | OROMUCOSAL | Status: AC
  Administered 2022-07-11: 15 mL via OROMUCOSAL

## 2022-07-11 MED ORDER — BUPIVACAINE HCL (PF) 0.5 % IJ SOLN
INTRAMUSCULAR | Status: AC
  Filled 2022-07-11: qty 60

## 2022-07-11 MED ORDER — BUPIVACAINE HCL (PF) 0.5 % IJ SOLN
INTRAMUSCULAR | Status: DC | PRN
  Administered 2022-07-11: 20 mL via PERINEURAL

## 2022-07-11 MED ORDER — TRANEXAMIC ACID-NACL 1000-0.7 MG/100ML-% IV SOLN
1000.0000 mg | INTRAVENOUS | Status: AC
  Administered 2022-07-11: 1000 mg via INTRAVENOUS
  Filled 2022-07-11: qty 100

## 2022-07-11 MED ORDER — HYDROCODONE-ACETAMINOPHEN 5-325 MG PO TABS: 1.0000 | ORAL_TABLET | Freq: Four times a day (QID) | ORAL | Status: DC | PRN

## 2022-07-11 MED ORDER — METHOCARBAMOL 500 MG PO TABS: 500.0000 mg | ORAL_TABLET | Freq: Three times a day (TID) | ORAL | Status: DC | PRN

## 2022-07-11 MED ORDER — HYDROCODONE-ACETAMINOPHEN 5-325 MG PO TABS
1.0000 | ORAL_TABLET | ORAL | Status: DC | PRN
  Administered 2022-07-12: 1 via ORAL
  Filled 2022-07-11: qty 1

## 2022-07-11 MED ORDER — ADULT MULTIVITAMIN W/MINERALS CH
1.0000 | ORAL_TABLET | Freq: Every day | ORAL | Status: DC
  Administered 2022-07-11 – 2022-07-12 (×2): 1 via ORAL
  Filled 2022-07-11 (×2): qty 1

## 2022-07-11 MED ORDER — DOCUSATE SODIUM 100 MG PO CAPS
100.0000 mg | ORAL_CAPSULE | Freq: Two times a day (BID) | ORAL | Status: DC
  Administered 2022-07-11 – 2022-07-12 (×2): 100 mg via ORAL
  Filled 2022-07-11 (×2): qty 1

## 2022-07-11 MED ORDER — TRANEXAMIC ACID-NACL 1000-0.7 MG/100ML-% IV SOLN
1000.0000 mg | Freq: Once | INTRAVENOUS | Status: AC
  Administered 2022-07-11: 1000 mg via INTRAVENOUS
  Filled 2022-07-11: qty 100

## 2022-07-11 MED ORDER — PHENYLEPHRINE 80 MCG/ML (10ML) SYRINGE FOR IV PUSH (FOR BLOOD PRESSURE SUPPORT)
PREFILLED_SYRINGE | INTRAVENOUS | Status: DC | PRN
  Administered 2022-07-11: 80 ug via INTRAVENOUS
  Administered 2022-07-11: 160 ug via INTRAVENOUS

## 2022-07-11 MED ORDER — METOCLOPRAMIDE HCL 5 MG/ML IJ SOLN: 5.0000 mg | Freq: Three times a day (TID) | INTRAMUSCULAR | Status: DC | PRN

## 2022-07-11 MED ORDER — BUPIVACAINE LIPOSOME 1.3 % IJ SUSP
INTRAMUSCULAR | Status: DC | PRN
  Administered 2022-07-11: 10 mL

## 2022-07-11 MED ORDER — ACETAMINOPHEN 325 MG PO TABS: 325.0000 mg | ORAL_TABLET | Freq: Four times a day (QID) | ORAL | Status: DC | PRN

## 2022-07-11 MED ORDER — FENTANYL CITRATE (PF) 100 MCG/2ML IJ SOLN
INTRAMUSCULAR | Status: DC | PRN
  Administered 2022-07-11 (×2): 50 ug via INTRAVENOUS

## 2022-07-11 MED ORDER — FAMOTIDINE 20 MG PO TABS
20.0000 mg | ORAL_TABLET | Freq: Every day | ORAL | Status: DC
  Administered 2022-07-11 – 2022-07-12 (×2): 20 mg via ORAL
  Filled 2022-07-11 (×2): qty 1

## 2022-07-11 MED ORDER — PHENOL 1.4 % MT LIQD: 1.0000 | OROMUCOSAL | Status: DC | PRN

## 2022-07-11 MED ORDER — SODIUM CHLORIDE 0.9 % IV SOLN: INTRAVENOUS | Status: DC

## 2022-07-11 MED ORDER — POLYETHYLENE GLYCOL 3350 17 G PO PACK: 17.0000 g | PACK | Freq: Every day | ORAL | Status: DC | PRN

## 2022-07-11 MED ORDER — FINASTERIDE 5 MG PO TABS
5.0000 mg | ORAL_TABLET | Freq: Every day | ORAL | Status: DC
  Administered 2022-07-11 – 2022-07-12 (×2): 5 mg via ORAL
  Filled 2022-07-11 (×2): qty 1

## 2022-07-11 MED ORDER — PHENYLEPHRINE HCL-NACL 20-0.9 MG/250ML-% IV SOLN
INTRAVENOUS | Status: DC | PRN
  Administered 2022-07-11: 40 ug/min via INTRAVENOUS

## 2022-07-11 MED ORDER — CEFAZOLIN SODIUM-DEXTROSE 2-4 GM/100ML-% IV SOLN
2.0000 g | INTRAVENOUS | Status: AC
  Administered 2022-07-11: 2 g via INTRAVENOUS
  Filled 2022-07-11: qty 100

## 2022-07-11 MED ORDER — FENTANYL CITRATE (PF) 100 MCG/2ML IJ SOLN
INTRAMUSCULAR | Status: AC
  Filled 2022-07-11: qty 2

## 2022-07-11 MED ORDER — CEFAZOLIN SODIUM-DEXTROSE 2-4 GM/100ML-% IV SOLN
2.0000 g | Freq: Four times a day (QID) | INTRAVENOUS | Status: AC
  Administered 2022-07-11 – 2022-07-12 (×3): 2 g via INTRAVENOUS
  Filled 2022-07-11 (×3): qty 100

## 2022-07-11 MED ORDER — ONDANSETRON HCL 4 MG/2ML IJ SOLN: 4.0000 mg | Freq: Four times a day (QID) | INTRAMUSCULAR | Status: DC | PRN

## 2022-07-11 MED ORDER — DEXAMETHASONE SODIUM PHOSPHATE 10 MG/ML IJ SOLN
INTRAMUSCULAR | Status: AC
  Filled 2022-07-11: qty 1

## 2022-07-11 MED ORDER — MENTHOL 3 MG MT LOZG: 1.0000 | LOZENGE | OROMUCOSAL | Status: DC | PRN

## 2022-07-11 MED ORDER — FENTANYL CITRATE PF 50 MCG/ML IJ SOSY
50.0000 ug | PREFILLED_SYRINGE | Freq: Once | INTRAMUSCULAR | Status: AC
  Administered 2022-07-11: 50 ug via INTRAVENOUS
  Filled 2022-07-11: qty 2

## 2022-07-11 MED ORDER — 0.9 % SODIUM CHLORIDE (POUR BTL) OPTIME
TOPICAL | Status: DC | PRN
  Administered 2022-07-11: 1000 mL

## 2022-07-11 MED ORDER — SUGAMMADEX SODIUM 200 MG/2ML IV SOLN
INTRAVENOUS | Status: DC | PRN
  Administered 2022-07-11: 200 mg via INTRAVENOUS

## 2022-07-11 MED ORDER — LIDOCAINE 2% (20 MG/ML) 5 ML SYRINGE
INTRAMUSCULAR | Status: DC | PRN
  Administered 2022-07-11: 40 mg via INTRAVENOUS

## 2022-07-11 MED ORDER — ORAL CARE MOUTH RINSE: 15.0000 mL | Freq: Once | OROMUCOSAL | Status: AC

## 2022-07-11 MED ORDER — EZETIMIBE-SIMVASTATIN 10-40 MG PO TABS: 1.0000 | ORAL_TABLET | Freq: Every day | ORAL | Status: DC

## 2022-07-11 MED ORDER — ONDANSETRON HCL 4 MG PO TABS: 4.0000 mg | ORAL_TABLET | Freq: Four times a day (QID) | ORAL | Status: DC | PRN

## 2022-07-11 MED ORDER — PROPOFOL 10 MG/ML IV BOLUS
INTRAVENOUS | Status: DC | PRN
  Administered 2022-07-11: 150 mg via INTRAVENOUS

## 2022-07-11 MED ORDER — METHOCARBAMOL 500 MG PO TABS: 500.0000 mg | ORAL_TABLET | Freq: Four times a day (QID) | ORAL | Status: DC | PRN

## 2022-07-11 MED ORDER — MIDAZOLAM HCL 2 MG/2ML IJ SOLN
1.0000 mg | Freq: Once | INTRAMUSCULAR | Status: AC
  Administered 2022-07-11: 1 mg via INTRAVENOUS
  Filled 2022-07-11: qty 2

## 2022-07-11 MED ORDER — ONDANSETRON HCL 4 MG/2ML IJ SOLN
INTRAMUSCULAR | Status: DC | PRN
  Administered 2022-07-11: 4 mg via INTRAVENOUS

## 2022-07-11 MED ORDER — ROCURONIUM BROMIDE 10 MG/ML (PF) SYRINGE
PREFILLED_SYRINGE | INTRAVENOUS | Status: DC | PRN
  Administered 2022-07-11: 80 mg via INTRAVENOUS

## 2022-07-11 MED ORDER — OXYCODONE-ACETAMINOPHEN 5-325 MG PO TABS: 1.0000 | ORAL_TABLET | Freq: Four times a day (QID) | ORAL | Status: DC | PRN

## 2022-07-11 MED ORDER — DEXAMETHASONE SODIUM PHOSPHATE 10 MG/ML IJ SOLN
INTRAMUSCULAR | Status: DC | PRN
  Administered 2022-07-11: 8 mg via INTRAVENOUS

## 2022-07-11 SURGICAL SUPPLY — 62 items
BAG COUNTER SPONGE SURGICOUNT (BAG) IMPLANT
BAG SPEC THK2 15X12 ZIP CLS (MISCELLANEOUS) ×1
BAG SPNG CNTER NS LX DISP (BAG)
BAG ZIPLOCK 12X15 (MISCELLANEOUS) ×2 IMPLANT
BIT DRILL 3.2 (BIT) ×1
BIT DRILL 3.2XCALB NS DISP (BIT) IMPLANT
BIT DRILL CALIBRATED 2.7 (BIT) IMPLANT
BIT DRL 3.2XCALB NS DISP (BIT) ×1
BOOTIES KNEE HIGH SLOAN (MISCELLANEOUS) ×4 IMPLANT
COVER SURGICAL LIGHT HANDLE (MISCELLANEOUS) ×2 IMPLANT
DRAPE ORTHO SPLIT 77X108 STRL (DRAPES) ×2
DRAPE POUCH INSTRU U-SHP 10X18 (DRAPES) ×2 IMPLANT
DRAPE SURG 17X11 SM STRL (DRAPES) ×2 IMPLANT
DRAPE SURG ORHT 6 SPLT 77X108 (DRAPES) IMPLANT
DRAPE U-SHAPE 47X51 STRL (DRAPES) IMPLANT
DRSG EMULSION OIL 3X3 NADH (GAUZE/BANDAGES/DRESSINGS) ×2 IMPLANT
DURAPREP 26ML APPLICATOR (WOUND CARE) ×2 IMPLANT
ELECT REM PT RETURN 15FT ADLT (MISCELLANEOUS) ×2 IMPLANT
GAUZE PAD ABD 8X10 STRL (GAUZE/BANDAGES/DRESSINGS) ×2 IMPLANT
GAUZE SPONGE 4X4 12PLY STRL (GAUZE/BANDAGES/DRESSINGS) ×2 IMPLANT
GLOVE BIOGEL PI IND STRL 7.5 (GLOVE) ×2 IMPLANT
GLOVE BIOGEL PI IND STRL 8.5 (GLOVE) ×2 IMPLANT
GLOVE ORTHO TXT STRL SZ7.5 (GLOVE) ×2 IMPLANT
GLOVE SURG ORTHO 8.5 STRL (GLOVE) ×2 IMPLANT
K-WIRE 2X5 SS THRDED S3 (WIRE) ×1
KIT BASIN OR (CUSTOM PROCEDURE TRAY) ×2 IMPLANT
KIT TURNOVER KIT A (KITS) IMPLANT
KWIRE 2X5 SS THRDED S3 (WIRE) IMPLANT
MANIFOLD NEPTUNE II (INSTRUMENTS) ×2 IMPLANT
NDL MA TROC 1/2 CIR (NEEDLE) ×2 IMPLANT
NDL MAYO CATGUT SZ4 TPR NDL (NEEDLE) ×2 IMPLANT
NDL SAFETY ECLIP 18X1.5 (MISCELLANEOUS) ×2 IMPLANT
NEEDLE MA TROC 1/2 CIR (NEEDLE) ×1 IMPLANT
NEEDLE MAYO CATGUT SZ4 (NEEDLE) ×1 IMPLANT
NS IRRIG 1000ML POUR BTL (IV SOLUTION) ×2 IMPLANT
PACK SHOULDER (CUSTOM PROCEDURE TRAY) ×2 IMPLANT
PASSER SUT SWANSON 36MM LOOP (INSTRUMENTS) ×2 IMPLANT
PEG LOCKING 3.2X32 (Peg) IMPLANT
PEG LOCKING 3.2X40 (Peg) IMPLANT
PEG LOCKING 3.2X42 (Screw) IMPLANT
PEG LOCKING 3.2X52 (Peg) IMPLANT
PLATE HUMERUS LP PROX L 3H (Plate) IMPLANT
PROTECTOR NERVE ULNAR (MISCELLANEOUS) ×2 IMPLANT
SCREW LOCK CORT STAR 3.5X26 (Screw) IMPLANT
SCREW LOW PROF TIS 3.5X28MM (Screw) IMPLANT
SCREW LP NL T15 3.5X26 (Screw) IMPLANT
SLEEVE MEASURING 3.2 (BIT) IMPLANT
SLING ARM FOAM STRAP LRG (SOFTGOODS) IMPLANT
SLING ARM IMMOBILIZER LRG (SOFTGOODS) ×2 IMPLANT
SPIKE FLUID TRANSFER (MISCELLANEOUS) ×2 IMPLANT
SPONGE SURGIFOAM ABS GEL 100 (HEMOSTASIS) ×2 IMPLANT
SPONGE T-LAP 4X18 ~~LOC~~+RFID (SPONGE) IMPLANT
STAPLER VISISTAT 35W (STAPLE) ×2 IMPLANT
STRIP CLOSURE SKIN 1/2X4 (GAUZE/BANDAGES/DRESSINGS) ×2 IMPLANT
SUT BONE WAX W31G (SUTURE) ×2 IMPLANT
SUT VIC AB 0 CT1 36 (SUTURE) ×4 IMPLANT
SUT VIC AB 2-0 CT1 27 (SUTURE) ×1
SUT VIC AB 2-0 CT1 TAPERPNT 27 (SUTURE) ×2 IMPLANT
SYR 50ML LL SCALE MARK (SYRINGE) ×2 IMPLANT
TAPE PAPER 3X10 WHT MICROPORE (GAUZE/BANDAGES/DRESSINGS) IMPLANT
TOWEL OR 17X26 10 PK STRL BLUE (TOWEL DISPOSABLE) ×4 IMPLANT
WATER STERILE IRR 1000ML POUR (IV SOLUTION) ×2 IMPLANT

## 2022-07-11 NOTE — Brief Op Note (Signed)
07/11/2022  4:30 PM  PATIENT:  Caroline Martin  77 y.o. female  PRE-OPERATIVE DIAGNOSIS:  left proximal humerus fracture, displaced and comminuted  POST-OPERATIVE DIAGNOSIS:  left proximal humerus fracture, displaced and comminuted  PROCEDURE:  Procedure(s) with comments: OPEN REDUCTION INTERNAL FIXATION (ORIF) PROXIMAL HUMERUS FRACTURE (Left) - general, choice with interscalene block Biomet ALPS plate  SURGEON:  Surgeon(s) and Role:    Netta Cedars, MD - Primary  PHYSICIAN ASSISTANT:   ASSISTANTS: [place] Bruns, PA-C   ANESTHESIA:   local and regional  EBL:  minimal  BLOOD ADMINISTERED:none  DRAINS: none   LOCAL MEDICATIONS USED:  MARCAINE     SPECIMEN:  No Specimen  DISPOSITION OF SPECIMEN:  N/A  COUNTS:  YES  TOURNIQUET:  * No tourniquets in log *  DICTATION: .Other Dictation: Dictation Number [id number]  PLAN OF CARE: Admit to observation  PATIENT DISPOSITION:  PACU - hemodynamically stable.   Delay start of Pharmacological VTE agent (>24hrs) due to surgical blood loss or risk of bleeding: not applicable

## 2022-07-11 NOTE — Discharge Instructions (Signed)
Ice to the shoulder constantly.  Keep the incision covered and clean and dry for one week, then ok to get it wet in the shower.  Do exercise as instructed several times per day. Pendulums. Gentle lap slides, gentle rotation Ok to use your left hand for gentle self care, activities of daily living  NO pushing pulling or lifting!  DO NOT reach behind your back or push up out of a chair with the operative arm.  Use a sling while you are up and around for comfort, may remove while seated.  Keep pillow propped behind the operative elbow so you can always see your elbow.   Follow up with Dr Veverly Fells in two weeks in the office, call [phone] for appt  Call Dr Veverly Fells  (cell) [phone] with any questions or concerns

## 2022-07-11 NOTE — Anesthesia Preprocedure Evaluation (Signed)
Anesthesia Evaluation  Patient identified by MRN, date of birth, ID band Patient awake    Reviewed: Allergy & Precautions, H&P , NPO status , Patient's Chart, lab work & pertinent test results  Airway Mallampati: II  TM Distance: >3 FB Neck ROM: Full    Dental no notable dental hx.    Pulmonary neg pulmonary ROS   Pulmonary exam normal breath sounds clear to auscultation       Cardiovascular hypertension, Pt. on medications Normal cardiovascular exam Rhythm:Regular Rate:Normal     Neuro/Psych negative neurological ROS  negative psych ROS   GI/Hepatic negative GI ROS, Neg liver ROS,,,  Endo/Other  negative endocrine ROS    Renal/GU negative Renal ROS  negative genitourinary   Musculoskeletal negative musculoskeletal ROS (+)    Abdominal   Peds negative pediatric ROS (+)  Hematology negative hematology ROS (+)   Anesthesia Other Findings   Reproductive/Obstetrics negative OB ROS                             Anesthesia Physical Anesthesia Plan  ASA: 2  Anesthesia Plan: General   Post-op Pain Management: Regional block*   Induction: Intravenous  PONV Risk Score and Plan: 3 and Ondansetron, Dexamethasone and Treatment may vary due to age or medical condition  Airway Management Planned: Oral ETT  Additional Equipment:   Intra-op Plan:   Post-operative Plan: Extubation in OR  Informed Consent: I have reviewed the patients History and Physical, chart, labs and discussed the procedure including the risks, benefits and alternatives for the proposed anesthesia with the patient or authorized representative who has indicated his/her understanding and acceptance.     Dental advisory given  Plan Discussed with: CRNA and Surgeon  Anesthesia Plan Comments:        Anesthesia Quick Evaluation

## 2022-07-11 NOTE — Anesthesia Procedure Notes (Signed)
Procedure Name: Intubation Date/Time: 07/11/2022 2:53 PM  Performed by: Niel Hummer, CRNAPre-anesthesia Checklist: Patient identified, Emergency Drugs available, Suction available and Patient being monitored Patient Re-evaluated:Patient Re-evaluated prior to induction Oxygen Delivery Method: Circle system utilized Preoxygenation: Pre-oxygenation with 100% oxygen Induction Type: IV induction Ventilation: Mask ventilation without difficulty Laryngoscope Size: Mac and 4 Grade View: Grade I Tube type: Oral Tube size: 7.0 mm Number of attempts: 1 Airway Equipment and Method: Stylet Placement Confirmation: ETT inserted through vocal cords under direct vision, positive ETCO2 and breath sounds checked- equal and bilateral Secured at: 22 cm Tube secured with: Tape Dental Injury: Teeth and Oropharynx as per pre-operative assessment

## 2022-07-11 NOTE — Op Note (Signed)
NAMETENAJA, OHLE MEDICAL RECORD NO: [id number] ACCOUNT NO: [id number] DATE OF BIRTH: 05-01-1946 FACILITY: [facility] PHYSICIAN: Doran Heater. Veverly Fells, MD  Operative Report   DATE OF PROCEDURE: 07/11/2022  PREOPERATIVE DIAGNOSIS:  Left comminuted and displaced proximal humerus fracture.  POSTOPERATIVE DIAGNOSIS:  Left comminuted and displaced proximal humerus fracture.  PROCEDURE PERFORMED:  ORIF left proximal humerus fracture using Biomet ALPS proximal humerus plate.  ATTENDING SURGEON:  Doran Heater. Veverly Fells, MD  ASSISTANT:  Charletta Cousin Dixon, [place], who was scrubbed during the entire procedure, and necessary for satisfactory completion of surgery.  ANESTHESIA:  General anesthesia was used plus interscalene block.  ESTIMATED BLOOD LOSS:  Minimal.  FLUID REPLACEMENT:  1000 mL crystalloid.  COUNTS:  Instrument counts correct.  COMPLICATIONS:  No complications.  ANTIBIOTICS:  Perioperative antibiotics were given.  INDICATIONS:  The patient is a 77 year old female who suffered a hard fall injuring her left shoulder.  The patient presented with a displaced and angulated left proximal humerus fracture.  The patient had a complete displacement of the humeral head  posteriorly relative to the shaft, which was pulled anteriorly.  Given the extreme displacement and high likelihood for malunion or nonunion affecting range of motion permanently, we recommended ORIF and the patient agreed.  Informed consent obtained.  DESCRIPTION OF PROCEDURE:  After an adequate level of anesthesia was achieved, the patient was positioned in modified beach chair position.  Left shoulder correctly identified and sterilely prepped and draped in the usual manner.  Timeout called,  verifying correct patient, correct site, we entered the patient's shoulder using standard deltopectoral approach, starting at the coracoid process and extending down to the anterior humerus.  Dissection down through subcutaneous  tissues using Bovie.   Cephalic vein was identified and taken laterally with the deltoid, pectoralis taken medially.  Conjoined tendon identified and retracted medially.  We released a little bit of the upper pec.  We then had good access to the underside the deltoid in the  lateral aspect of the humerus.  The fracture site was identified.  We were able to basically utilizing a combination of anterior translation of the elbow and posterior translation of the proximal humeral shaft and then I used my finger posteriorly and  pulled the humeral head back up onto the humeral shaft.  We then placed a 3 hole ALPS plate proximal humerus plate left side low profile on the lateral humerus just lateral to the biceps groove, placed a single guide pin through the guide pin hole into  the center of the head. I was happy with that position.  We then placed a shaft screw distally in the sliding shaft screw hole.  Once I was happy with the plate positioning relative to the fracture site the fracture was reduced and stable, we began to  fill the smooth peg holes proximally, which were locking pegs.  We did a total of 4 of those and we got good purchase into the humerus.  The anterior peg holes we just could not get the pegs in there that the top one I though we could, but then that peg  actually went just outside the bone anteriorly, so we went down the shaft and placed 2 additional shaft screws, so we had two nonlocking and 1 locked in the shaft and then we had excellent purchase of the humeral head. With the fracture aligned  properly and hardware in good position I did an assessment of the greater tuberosity, which I felt was stable and  did not required additional suturing. Gweneth Dimitri was also intact and everything moved together as a unit under fluoroscopy.  Final x-rays  obtained.  Screw lengths were appropriate.  We irrigated thoroughly and then repaired the deltopectoral interval with 0 Vicryl suture followed by 2-0  Vicryl for subcutaneous closure and staples for skin.  Sterile compressive bandage applied.  Patient  transported to recovery room in stable condition.   PUS D: 07/11/2022 4:42:12 pm T: 07/11/2022 8:16:00 pm  JOB: [id number]

## 2022-07-11 NOTE — Anesthesia Procedure Notes (Signed)
Anesthesia Regional Block: Interscalene brachial plexus block   Pre-Anesthetic Checklist: , timeout performed,  Correct Patient, Correct Site, Correct Laterality,  Correct Procedure, Correct Position, site marked,  Risks and benefits discussed,  Surgical consent,  Pre-op evaluation,  At surgeon's request and post-op pain management  Laterality: Left  Prep: chloraprep       Needles:  Injection technique: Single-shot  Needle Type: Echogenic Stimulator Needle     Needle Length: 9cm      Additional Needles:   Procedures:,,,, ultrasound used (permanent image in chart),,     Nerve Stimulator or Paresthesia:  Response: 0.58 mA  Additional Responses:   Narrative:  Start time: 07/11/2022 1:50 PM End time: 07/11/2022 2:00 PM Injection made incrementally with aspirations every 5 mL.  Performed by: Personally  Anesthesiologist: Myrtie Soman, MD  Additional Notes: Patient tolerated the procedure well without complications

## 2022-07-11 NOTE — Transfer of Care (Signed)
Immediate Anesthesia Transfer of Care Note  Patient: Caroline Martin  Procedure(s) Performed: OPEN REDUCTION INTERNAL FIXATION (ORIF) PROXIMAL HUMERUS FRACTURE (Left)  Patient Location: PACU  Anesthesia Type:General  Level of Consciousness: drowsy  Airway & Oxygen Therapy: Patient Spontanous Breathing and Patient connected to face mask oxygen  Post-op Assessment: Report given to RN and Post -op Vital signs reviewed and stable  Post vital signs: Reviewed and stable  Last Vitals:  Vitals Value Taken Time  BP 177/87 07/11/22 1639  Temp    Pulse 94 07/11/22 1639  Resp 20 07/11/22 1639  SpO2 100 % 07/11/22 1639  Vitals shown include unvalidated device data.  Last Pain:  Vitals:   07/11/22 1407  TempSrc:   PainSc: 0-No pain         Complications: No notable events documented.

## 2022-07-11 NOTE — Interval H&P Note (Signed)
History and Physical Interval Note:  07/11/2022 2:39 PM  Caroline Martin  has presented today for surgery, with the diagnosis of left proximal humerus fracture.  The various methods of treatment have been discussed with the patient and family. After consideration of risks, benefits and other options for treatment, the patient has consented to  Procedure(s) with comments: OPEN REDUCTION INTERNAL FIXATION (ORIF) PROXIMAL HUMERUS FRACTURE (Left) - general, choice with interscalene block as a surgical intervention.  The patient's history has been reviewed, patient examined, no change in status, stable for surgery.  I have reviewed the patient's chart and labs.  Questions were answered to the patient's satisfaction.     Augustin Schooling

## 2022-07-11 NOTE — Anesthesia Procedure Notes (Signed)
Anesthesia Procedure Image    

## 2022-07-12 DIAGNOSIS — Z79899 Other long term (current) drug therapy: Secondary | ICD-10-CM | POA: Diagnosis not present

## 2022-07-12 DIAGNOSIS — Z96641 Presence of right artificial hip joint: Secondary | ICD-10-CM | POA: Diagnosis not present

## 2022-07-12 DIAGNOSIS — S42352A Displaced comminuted fracture of shaft of humerus, left arm, initial encounter for closed fracture: Secondary | ICD-10-CM | POA: Diagnosis not present

## 2022-07-12 DIAGNOSIS — I10 Essential (primary) hypertension: Secondary | ICD-10-CM | POA: Diagnosis not present

## 2022-07-12 NOTE — Discharge Summary (Signed)
In most cases prophylactic antibiotics for Dental procdeures after total joint surgery are not necessary.  Exceptions are as follows:  1. History of prior total joint infection  2. Severely immunocompromised (Organ Transplant, cancer chemotherapy, Rheumatoid biologic meds such as [place])  3. Poorly controlled diabetes (A1C &gt; 8.0, blood glucose over 200)  If you have one of these conditions, contact your surgeon for an antibiotic prescription, prior to your dental procedure. Orthopedic Discharge Summary        Physician Discharge Summary  Patient ID: Caroline Martin MRN: [id number] DOB/AGE: 1946/02/26 77 y.o.  Admit date: 07/11/2022 Discharge date: 07/12/2022   Procedures:  Procedure(s) (LRB): OPEN REDUCTION INTERNAL FIXATION (ORIF) PROXIMAL HUMERUS FRACTURE (Left)  Attending Physician:  Dr. 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 Patient tolerated the procedure well and brought to the recovery room in good condition and subsequently to the floor. Patient had an uncomplicated hospital course and was stable for discharge.   Disposition: Discharge disposition: 01-Home or Self Care      with follow up in 2 weeks    Follow-up Information     Netta Cedars, MD. Call in 2 week(s).   Specialty: Orthopedic Surgery Why: call [phone] for appt in two weeks in the office Contact information: [address] [place] [address] [phone]                  Dental Antibiotics:  In most cases prophylactic antibiotics for Dental procdeures after total joint surgery are not necessary.  Exceptions are as follows:  1. History of prior total joint infection  2. Severely immunocompromised (Organ Transplant, cancer chemotherapy, Rheumatoid biologic meds such as [place])  3. Poorly controlled diabetes (A1C &gt; 8.0, blood glucose over 200)  If you have one of these conditions, contact your surgeon for an antibiotic prescription, prior to your dental procedure.  Discharge Instructions     Call MD / Call 911   Complete by: As directed    If you experience chest pain or shortness of breath, CALL 911 and be transported to the hospital emergency room.  If you develope a fever above 101 F, pus (white drainage) or increased drainage or redness at the wound, or calf pain, call your surgeon's office.   Constipation Prevention   Complete by: As directed    Drink plenty of fluids.  Prune juice may be helpful.  You may use a stool softener, such as Colace (over the counter) 100 mg twice a day.  Use MiraLax (over the counter) for constipation as needed.   Diet - low sodium heart healthy   Complete by: As directed    Increase activity slowly as tolerated   Complete by: As directed    Post-operative opioid taper instructions:   Complete by: As directed    POST-OPERATIVE OPIOID TAPER INSTRUCTIONS: It is important to wean  off of your opioid medication as soon as possible. If you do not need pain medication after your surgery it is ok to stop day one. Opioids include: Codeine, Hydrocodone(Norco, Vicodin), Oxycodone(Percocet, oxycontin) and hydromorphone amongst others.  Long term and even short term use of opiods can cause: Increased pain response Dependence Constipation Depression Respiratory depression And more.  Withdrawal symptoms can include Flu like symptoms Nausea, vomiting And more Techniques to manage these  symptoms Hydrate well Eat regular healthy meals Stay active Use relaxation techniques(deep breathing, meditating, yoga) Do Not substitute Alcohol to help with tapering If you have been on opioids for less than two weeks and do not have pain than it is ok to stop all together.  Plan to wean off of opioids This plan should start within one week post op of your joint replacement. Maintain the same interval or time between taking each dose and first decrease the dose.  Cut the total daily intake of opioids by one tablet each day Next start to increase the time between doses. The last dose that should be eliminated is the evening dose.          Allergies as of 07/12/2022   No Known Allergies      Medication List     STOP taking these medications    oxyCODONE-acetaminophen 5-325 MG tablet Commonly known as: PERCOCET/ROXICET       TAKE these medications    acetaminophen 500 MG tablet Commonly known as: TYLENOL Take 1,000 mg by mouth every 6 (six) hours as needed for mild pain.   calcipotriene-betamethasone ointment Commonly known as: TACLONEX Apply 1 Application topically 2 (two) times daily as needed (psoriasis).   cholecalciferol 10 MCG (400 UNIT) Tabs tablet Commonly known as: VITAMIN D3 Take 400 Units by mouth daily.   cimetidine 200 MG tablet Commonly known as: TAGAMET Take 200 mg by mouth daily as needed (heartburn).   ciprofloxacin 500 MG tablet Commonly known as: CIPRO Take 500 mg by mouth 2 (two) times daily.   DULoxetine 60 MG capsule Commonly known as: CYMBALTA Take 60 mg by mouth daily.   ezetimibe-simvastatin 10-40 MG tablet Commonly known as: VYTORIN Take 1 tablet by mouth at bedtime.   finasteride 5 MG tablet Commonly known as: PROSCAR Take 5 mg by mouth daily.   hydrochlorothiazide 25 MG tablet Commonly known as: HYDRODIURIL Take 25 mg by mouth daily.   HYDROcodone-acetaminophen 5-325 MG tablet Commonly known as: NORCO/VICODIN Take 1  tablet by mouth 4 (four) times daily as needed.   hydroxychloroquine 200 MG tablet Commonly known as: PLAQUENIL Take 200 mg by mouth daily.   methocarbamol 500 MG tablet Commonly known as: ROBAXIN Take 1 tablet (500 mg total) by mouth 3 (three) times daily as needed for muscle spasms.   multivitamin tablet Take 1 tablet by mouth daily.   ramipril 10 MG tablet Commonly known as: ALTACE Take 10 mg by mouth daily.   SLOW RELEASE IRON PO Take 1 tablet by mouth every other day.   VITAMIN-B COMPLEX PO Take 1 capsule by mouth daily.          Signed: Augustin Schooling 07/12/2022, 8:02 AM  [facility] is now [facility] [address], [place], [place], [place] [address] Phone: [place]

## 2022-07-12 NOTE — Progress Notes (Signed)
  Transition of Care (TOC) Screening Note   Patient Details  Name: Caroline Martin Date of Birth: 12-21-1945   Transition of Care [facility]) CM/SW Contact:    Lennart Pall, LCSW Phone Number: 07/12/2022, 9:44 AM    Transition of Care Department [facility]) has reviewed patient and no TOC needs have been identified at this time. We will continue to monitor patient advancement through interdisciplinary progression rounds. If new patient transition needs arise, please place a TOC consult.

## 2022-07-12 NOTE — Plan of Care (Signed)
  Problem: Activity: Goal: Ability to tolerate increased activity will improve Outcome: Progressing   Problem: Pain Management: Goal: Pain level will decrease with appropriate interventions Outcome: Progressing   

## 2022-07-12 NOTE — Evaluation (Signed)
Occupational Therapy Evaluation Patient Details Name: Caroline Martin MRN: [id number] DOB: [birth year] Today's Date: 07/12/2022   History of Present Illness Patient is a 77 year old woman s/p ORIF left proximal humerus fracture   Clinical Impression   Ms. Allisen Pidgeon is a 77 year old woman s/p shoulder replacement without functional use of left non-dominant upper extremity secondary to effects of surgery and interscalene block and shoulder precautions. Therapist provided education and instruction to patient in regards to exercises, precautions, positioning, donning upper extremity clothing and bathing while maintaining shoulder precautions, ice and edema management and donning/doffing sling. Patient and verbalized understanding. Patient needed min assist for UB ADLs due to block still being in effect. She has assistance of her sister at discharge. Patient to follow up with MD for further therapy needs.        Recommendations for follow up therapy are one component of a multi-disciplinary discharge planning process, led by the attending physician.  Recommendations may be updated based on patient status, additional functional criteria and insurance authorization.   Follow Up Recommendations  Follow physician's recommendations for discharge plan and follow up therapies     Assistance Recommended at Discharge PRN  Patient can return home with the following Assistance with cooking/housework;A little help with bathing/dressing/bathroom    Functional Status Assessment  Patient has had a recent decline in their functional status and demonstrates the ability to make significant improvements in function in a reasonable and predictable amount of time.  Equipment Recommendations  None recommended by OT    Recommendations for Other Services       Precautions / Restrictions Precautions Precautions: Shoulder Type of Shoulder Precautions: Sling At all times except ADL / exercise   Non weight bearing  Yes   AROM elbow, wrist and hand to tolerance Yes   AROM / PROM Forward Flexion 0-90   AROM / PROM Abduction 0-60   AROM / PROM External Rotation 0-30     no resistance but ok for her to do pendulums, lap slides and gentle hand to face ADLs, NWB though, need to make sure elbow is propped forward to at least the mid-axillary line, thanks! Shoulder Interventions: Shoulder sling/immobilizer;Off for dressing/bathing/exercises Precaution Booklet Issued: Yes (comment) Required Braces or Orthoses: Sling Restrictions Weight Bearing Restrictions: Yes LUE Weight Bearing: Weight bearing as tolerated      Mobility Bed Mobility Overal bed mobility: Modified Independent                  Transfers Overall transfer level: Modified independent                        Balance Overall balance assessment: No apparent balance deficits (not formally assessed)                                         ADL either performed or assessed with clinical judgement   ADL                                         General ADL Comments: Instructed on compensatory strategies for dressing and bathing. Needed min assist to manage shirt. has purchases buttoned sleeve shirts to improve ease of task. Has a shower chair if needed. Mod I for LB ADLs and toileting.  Instructed on how to don sling and positioning.     Vision Patient Visual Report: No change from baseline       Perception     Praxis      Pertinent Vitals/Pain Pain Assessment Pain Assessment: No/denies pain (secondary to block\)     Hand Dominance Right   Extremity/Trunk Assessment Upper Extremity Assessment Upper Extremity Assessment: LUE deficits/detail LUE Deficits / Details: impaired sensation and motor control secondary to block   Lower Extremity Assessment Lower Extremity Assessment: Overall WFL for tasks assessed   Cervical / Trunk Assessment Cervical / Trunk Assessment: Normal    Communication Communication Communication: No difficulties   Cognition Arousal/Alertness: Awake/alert Behavior During Therapy: WFL for tasks assessed/performed Overall Cognitive Status: Within Functional Limits for tasks assessed                                       General Comments       Exercises     Shoulder Instructions Shoulder Instructions Donning/doffing shirt without moving shoulder: Patient able to independently direct caregiver Method for sponge bathing under operated UE: Patient able to independently direct caregiver Donning/doffing sling/immobilizer: Patient able to independently direct caregiver Correct positioning of sling/immobilizer: Independent Pendulum exercises (written home exercise program): Independent ROM for elbow, wrist and digits of operated UE: Independent Sling wearing schedule (on at all times/off for ADL's): Independent Proper positioning of operated UE when showering: Independent Dressing change: Independent Positioning of UE while sleeping: [place] expects to be discharged to:: Private residence   Available Help at Discharge: Family;Available 24 hours/day (will be going to her sister's house)                         Home Equipment: Shower seat          Prior Functioning/Environment                          OT Problem List: Decreased range of motion;Decreased strength;Pain;Impaired UE functional use      OT Treatment/Interventions:      OT Goals(Current goals can be found in the care plan section) Acute Rehab OT Goals OT Goal Formulation: All assessment and education complete, DC therapy  OT Frequency:      Co-evaluation              AM-PAC OT "6 Clicks" Daily Activity     Outcome Measure Help from another person eating meals?: A Little Help from another person taking care of personal grooming?: None Help from another person toileting, which includes  using toliet, bedpan, or urinal?: None Help from another person bathing (including washing, rinsing, drying)?: A Little Help from another person to put on and taking off regular upper body clothing?: A Little Help from another person to put on and taking off regular lower body clothing?: None 6 Click Score: 21   End of Session Nurse Communication:  (OT education complete)  Activity Tolerance: Patient tolerated treatment well Patient left: in chair;with call bell/phone within reach  OT Visit Diagnosis: Pain                Time: 0925-0959 OT Time Calculation (min): 34 min Charges:  OT General Charges $OT Visit: 1 Visit OT Evaluation $OT Eval Low Complexity: 1 Low OT Treatments $Self Care/Home Management : 8-22 mins  Gustavo Lah, OTR/L [place]  Office [phone]   Lenward Chancellor 07/12/2022, 10:49 AM

## 2022-07-12 NOTE — Progress Notes (Signed)
Orthopedics Progress Note  Subjective: No pain this AM, block still working  Objective:  Vitals:   07/12/22 0206 07/12/22 0537  BP: (!) 117/103 102/76  Pulse: 85 81  Resp: 18 18  Temp: 97.8 F (36.6 C) 97.8 F (36.6 C)  SpO2: 93% 94%    General: Awake and alert  Musculoskeletal: Left shoulder dressing CDI,  Minimal movement in fingers due to block  Lab Results  Component Value Date   WBC 8.8 07/11/2022   HGB 12.3 07/11/2022   HCT 37.1 07/11/2022   MCV 84.3 07/11/2022   PLT 312 07/11/2022       Component Value Date/Time   NA 132 (L) 07/11/2022 1255   K 3.4 (L) 07/11/2022 1255   CL 89 (L) 07/11/2022 1255   CO2 29 07/11/2022 1255   GLUCOSE 105 (H) 07/11/2022 1255   BUN 13 07/11/2022 1255   CREATININE 0.72 07/11/2022 1255   CALCIUM 9.2 07/11/2022 1255   GFRNONAA >60 07/11/2022 1255   GFRAA >60 07/31/2016 0425    Lab Results  Component Value Date   INR 0.93 09/20/2011    Assessment/Plan: POD #1 s/p Procedure(s): OPEN REDUCTION INTERNAL FIXATION (ORIF) PROXIMAL HUMERUS FRACTURE Stable for discharge after therapy Follow up in two weeks  Doran Heater. Veverly Fells, MD 07/12/2022 8:01 AM

## 2022-07-12 NOTE — Progress Notes (Signed)
Discharge package printed and instructions given to patient. Verbalizes understanding.  

## 2022-07-12 NOTE — Anesthesia Postprocedure Evaluation (Signed)
Anesthesia Post Note  Patient: PANDA CROSSIN  Procedure(s) Performed: OPEN REDUCTION INTERNAL FIXATION (ORIF) PROXIMAL HUMERUS FRACTURE (Left)     Patient location during evaluation: PACU Anesthesia Type: General Level of consciousness: awake and alert Pain management: pain level controlled Vital Signs Assessment: post-procedure vital signs reviewed and stable Respiratory status: spontaneous breathing, nonlabored ventilation, respiratory function stable and patient connected to nasal cannula oxygen Cardiovascular status: blood pressure returned to baseline and stable Postop Assessment: no apparent nausea or vomiting Anesthetic complications: no  No notable events documented.  Last Vitals:  Vitals:   07/12/22 0206 07/12/22 0537  BP: (!) 117/103 102/76  Pulse: 85 81  Resp: 18 18  Temp: 36.6 C 36.6 C  SpO2: 93% 94%    Last Pain:  Vitals:   07/12/22 0729  TempSrc:   PainSc: 0-No pain                 Gino Garrabrant S

## 2022-07-13 ENCOUNTER — Encounter (HOSPITAL_COMMUNITY): Payer: Self-pay | Admitting: Orthopedic Surgery

## 2022-07-25 DIAGNOSIS — Z4789 Encounter for other orthopedic aftercare: Secondary | ICD-10-CM | POA: Diagnosis not present

## 2022-08-16 ENCOUNTER — Encounter: Payer: Self-pay | Admitting: Gastroenterology

## 2022-08-30 DIAGNOSIS — M25612 Stiffness of left shoulder, not elsewhere classified: Secondary | ICD-10-CM | POA: Diagnosis not present

## 2022-08-30 DIAGNOSIS — M25512 Pain in left shoulder: Secondary | ICD-10-CM | POA: Diagnosis not present

## 2022-09-12 DIAGNOSIS — M25612 Stiffness of left shoulder, not elsewhere classified: Secondary | ICD-10-CM | POA: Diagnosis not present

## 2022-09-12 DIAGNOSIS — M25512 Pain in left shoulder: Secondary | ICD-10-CM | POA: Diagnosis not present

## 2022-09-13 DIAGNOSIS — Z96641 Presence of right artificial hip joint: Secondary | ICD-10-CM | POA: Diagnosis not present

## 2022-09-13 DIAGNOSIS — M25552 Pain in left hip: Secondary | ICD-10-CM | POA: Diagnosis not present

## 2022-09-14 DIAGNOSIS — M25512 Pain in left shoulder: Secondary | ICD-10-CM | POA: Diagnosis not present

## 2022-09-14 DIAGNOSIS — M25612 Stiffness of left shoulder, not elsewhere classified: Secondary | ICD-10-CM | POA: Diagnosis not present

## 2022-09-17 DIAGNOSIS — M25612 Stiffness of left shoulder, not elsewhere classified: Secondary | ICD-10-CM | POA: Diagnosis not present

## 2022-09-17 DIAGNOSIS — M25512 Pain in left shoulder: Secondary | ICD-10-CM | POA: Diagnosis not present

## 2022-09-18 DIAGNOSIS — Z4789 Encounter for other orthopedic aftercare: Secondary | ICD-10-CM | POA: Diagnosis not present

## 2022-09-19 DIAGNOSIS — M25512 Pain in left shoulder: Secondary | ICD-10-CM | POA: Diagnosis not present

## 2022-09-19 DIAGNOSIS — M25612 Stiffness of left shoulder, not elsewhere classified: Secondary | ICD-10-CM | POA: Diagnosis not present

## 2022-10-03 DIAGNOSIS — M25512 Pain in left shoulder: Secondary | ICD-10-CM | POA: Diagnosis not present

## 2022-10-03 DIAGNOSIS — M25612 Stiffness of left shoulder, not elsewhere classified: Secondary | ICD-10-CM | POA: Diagnosis not present

## 2022-10-17 DIAGNOSIS — M25612 Stiffness of left shoulder, not elsewhere classified: Secondary | ICD-10-CM | POA: Diagnosis not present

## 2022-10-17 DIAGNOSIS — M25512 Pain in left shoulder: Secondary | ICD-10-CM | POA: Diagnosis not present

## 2022-10-24 DIAGNOSIS — M25612 Stiffness of left shoulder, not elsewhere classified: Secondary | ICD-10-CM | POA: Diagnosis not present

## 2022-10-24 DIAGNOSIS — M25512 Pain in left shoulder: Secondary | ICD-10-CM | POA: Diagnosis not present

## 2022-10-31 DIAGNOSIS — M25612 Stiffness of left shoulder, not elsewhere classified: Secondary | ICD-10-CM | POA: Diagnosis not present

## 2022-10-31 DIAGNOSIS — M25512 Pain in left shoulder: Secondary | ICD-10-CM | POA: Diagnosis not present

## 2022-11-01 ENCOUNTER — Inpatient Hospital Stay (HOSPITAL_BASED_OUTPATIENT_CLINIC_OR_DEPARTMENT_OTHER): Payer: Medicare PPO

## 2022-11-01 ENCOUNTER — Encounter: Payer: Self-pay | Admitting: Hematology and Oncology

## 2022-11-01 ENCOUNTER — Inpatient Hospital Stay: Payer: Medicare PPO | Attending: Hematology and Oncology | Admitting: Hematology and Oncology

## 2022-11-01 VITALS — BP 154/87 | HR 81 | Temp 97.9°F | Resp 19 | Ht 65.5 in | Wt 210.1 lb

## 2022-11-01 DIAGNOSIS — I10 Essential (primary) hypertension: Secondary | ICD-10-CM | POA: Diagnosis not present

## 2022-11-01 DIAGNOSIS — Z803 Family history of malignant neoplasm of breast: Secondary | ICD-10-CM | POA: Insufficient documentation

## 2022-11-01 DIAGNOSIS — E785 Hyperlipidemia, unspecified: Secondary | ICD-10-CM | POA: Diagnosis not present

## 2022-11-01 DIAGNOSIS — D509 Iron deficiency anemia, unspecified: Secondary | ICD-10-CM | POA: Insufficient documentation

## 2022-11-01 DIAGNOSIS — F32A Depression, unspecified: Secondary | ICD-10-CM | POA: Diagnosis not present

## 2022-11-01 DIAGNOSIS — Z79899 Other long term (current) drug therapy: Secondary | ICD-10-CM | POA: Diagnosis not present

## 2022-11-01 DIAGNOSIS — F419 Anxiety disorder, unspecified: Secondary | ICD-10-CM | POA: Insufficient documentation

## 2022-11-01 DIAGNOSIS — K219 Gastro-esophageal reflux disease without esophagitis: Secondary | ICD-10-CM | POA: Diagnosis not present

## 2022-11-01 LAB — CBC WITH DIFFERENTIAL/PLATELET
Abs Immature Granulocytes: 0.01 10*3/uL (ref 0.00–0.07)
Basophils Absolute: 0.1 10*3/uL (ref 0.0–0.1)
Basophils Relative: 2 %
Eosinophils Absolute: 0.1 10*3/uL (ref 0.0–0.5)
Eosinophils Relative: 2 %
HCT: 42.7 % (ref 36.0–46.0)
Hemoglobin: 14.3 g/dL (ref 12.0–15.0)
Immature Granulocytes: 0 %
Lymphocytes Relative: 32 %
Lymphs Abs: 1.5 10*3/uL (ref 0.7–4.0)
MCH: 28.6 pg (ref 26.0–34.0)
MCHC: 33.5 g/dL (ref 30.0–36.0)
MCV: 85.4 fL (ref 80.0–100.0)
Monocytes Absolute: 0.4 10*3/uL (ref 0.1–1.0)
Monocytes Relative: 9 %
Neutro Abs: 2.6 10*3/uL (ref 1.7–7.7)
Neutrophils Relative %: 55 %
Platelets: 239 10*3/uL (ref 150–400)
RBC: 5 MIL/uL (ref 3.87–5.11)
RDW: 13.5 % (ref 11.5–15.5)
WBC: 4.7 10*3/uL (ref 4.0–10.5)
nRBC: 0 % (ref 0.0–0.2)

## 2022-11-01 NOTE — Progress Notes (Signed)
[facility] CONSULT NOTE  Patient Care Team: Adrian Prince, MD as PCP - General (Endocrinology)  CHIEF COMPLAINTS/PURPOSE OF CONSULTATION:  IDA  ASSESSMENT & PLAN:   This is a very pleasant 77 year old female patient with hypertension, dyslipidemia referred to hematology for evaluation of iron deficiency anemia.   Since last visit, she has noted some weight gain, about 7 lbs. We reviewed labs today which showed significant improvement in anemia. She can continue oral iron once a day, recommended to get some ferritin drawn at PCP's office if possible, this was not drawn today. Hopefully they can send a copy of the report to [place]. At this time since she improved clinically as well as lab wise, she was encouraged to RTC in 6 months or sooner as needed. Thank you for consulting [place] in the care of this patient.  Please do not hesitate to contact us with any additional questions or concerns.   HISTORY OF PRESENTING ILLNESS:  Caroline Martin 77 y.o. female is here because of IDA  This is a very pleasant 77 year old female patient with past medical history significant for hypertension, dyslipidemia, depression, referred to hematology for evaluation of iron deficiency anemia  Since last visit, she has felt quite well except for weight gain. She had shoulder fracture left tripped on something, had surgery. She is still sore but healing as expected. She has not noticed any blood in stool or black stool. She is still taking oral iron once a day. 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   SUI (stress urinary incontinence, female)     SURGICAL HISTORY: Past Surgical History:  Procedure Laterality Date   ANTERIOR  CRUCIATE LIGAMENT REPAIR Right 2022   and meniscus repai   AUGMENTATION MAMMAPLASTY     CATARACT EXTRACTION W/ INTRAOCULAR LENS IMPLANT Bilateral    CHOLECYSTECTOMY N/A 07/30/2016   Procedure: LAPAROSCOPIC CHOLECYSTECTOMY WITH INTRAOPERATIVE CHOLANGIOGRAM;  Surgeon: Glenna Fellows, MD;  Location: [facility];  Service: General;  Laterality: N/A;   CYSTOSCOPY  09/20/2011   Procedure: Bluford Kaufmann;  Surgeon: Martina Sinner, MD;  Location: [facility];  Service: Urology;  Laterality: N/A;  Monarc sling   ORIF HUMERUS FRACTURE Left 07/11/2022   Procedure: OPEN REDUCTION INTERNAL FIXATION (ORIF) PROXIMAL HUMERUS FRACTURE;  Surgeon: Beverely Low, MD;  Location: [facility];  Service: Orthopedics;  Laterality: Left;  general, choice with interscalene block   PUBOVAGINAL SLING  06/04/2000   PUBOVAGINAL SLING  09/20/2011   Procedure: Leonides Grills;  Surgeon: Martina Sinner, MD;  Location: [facility];  Service: Urology;  Laterality: N/A;   TOTAL HIP ARTHROPLASTY Right     SOCIAL HISTORY: Social History   Socioeconomic History   Marital status: Divorced    Spouse name: Not on file   Number of children: 2   Years of education: Not on file   Highest education level: Not on file  Occupational History   Occupation: retired  Tobacco Use   Smoking status: Never   Smokeless tobacco: Never  Vaping Use   Vaping Use: Never used  Substance and Sexual Activity  Alcohol use: Not Currently   Drug use: No   Sexual activity: Not Currently    Birth control/protection: Post-menopausal    Comment: 1st intercourse 8 yo-5 partners  Other Topics Concern   Not on file  Social History Narrative   Not on file   Social Determinants of Health   Financial Resource Strain: Not on file  Food Insecurity: No Food Insecurity (07/11/2022)   Hunger Vital Sign    Worried About Running Out of Food in the Last Year: Never true    Ran Out of Food in the Last Year: Never true   Transportation Needs: No Transportation Needs (07/11/2022)   PRAPARE - Administrator, Civil Service (Medical): No    Lack of Transportation (Non-Medical): No  Physical Activity: Not on file  Stress: Not on file  Social Connections: Not on file  Intimate Partner Violence: Not At Risk (07/11/2022)   Humiliation, Afraid, Rape, and Kick questionnaire    Fear of Current or Ex-Partner: No    Emotionally Abused: No    Physically Abused: No    Sexually Abused: No    FAMILY HISTORY: Family History  Problem Relation Age of Onset   Breast cancer Mother 61   Heart attack Father    Prostatitis Father    Other Sister        breast fibrosis   Diabetes Son     ALLERGIES:  has No Known Allergies.  MEDICATIONS:  Current Outpatient Medications  Medication Sig Dispense Refill   acetaminophen (TYLENOL) 500 MG tablet Take 1,000 mg by mouth every 6 (six) hours as needed for mild pain.     B Complex Vitamins (VITAMIN-B COMPLEX PO) Take 1 capsule by mouth daily.     calcipotriene-betamethasone (TACLONEX) ointment Apply 1 Application topically 2 (two) times daily as needed (psoriasis).     cholecalciferol (VITAMIN D) 400 units TABS tablet Take 400 Units by mouth daily.     cimetidine (TAGAMET) 200 MG tablet Take 200 mg by mouth daily as needed (heartburn).     ciprofloxacin (CIPRO) 500 MG tablet Take 500 mg by mouth 2 (two) times daily.     DULoxetine (CYMBALTA) 60 MG capsule Take 60 mg by mouth daily.     ezetimibe-simvastatin (VYTORIN) 10-40 MG per tablet Take 1 tablet by mouth at bedtime.     Ferrous Sulfate (SLOW RELEASE IRON PO) Take 1 tablet by mouth every other day.     finasteride (PROSCAR) 5 MG tablet Take 5 mg by mouth daily.     hydrochlorothiazide (HYDRODIURIL) 25 MG tablet Take 25 mg by mouth daily.     HYDROcodone-acetaminophen (NORCO/VICODIN) 5-325 MG tablet Take 1 tablet by mouth 4 (four) times daily as needed.     hydroxychloroquine (PLAQUENIL) 200 MG tablet Take 200 mg by  mouth daily.     methocarbamol (ROBAXIN) 500 MG tablet Take 1 tablet (500 mg total) by mouth 3 (three) times daily as needed for muscle spasms. (Patient not taking: Reported on 07/10/2022) 21 tablet 0   Multiple Vitamin (MULTIVITAMIN) tablet Take 1 tablet by mouth daily.     ramipril (ALTACE) 10 MG tablet Take 10 mg by mouth daily.     No current facility-administered medications for this visit.     PHYSICAL EXAMINATION: ECOG PERFORMANCE STATUS: 0 - Asymptomatic  Vitals:   11/01/22 1000  BP: (!) 154/87  Pulse: 81  Resp: 19  Temp: 97.9 F (36.6 C)  SpO2: 95%   Filed Weights   11/01/22  1000  Weight: 210 lb 2 oz (95.3 kg)    Physical Exam Constitutional:      Appearance: Normal appearance.  Cardiovascular:     Rate and Rhythm: Normal rate and regular rhythm.  Pulmonary:     Effort: Pulmonary effort is normal.     Breath sounds: Normal breath sounds.  Abdominal:     General: Abdomen is flat.     Palpations: Abdomen is soft.  Musculoskeletal:        General: No swelling or tenderness.     Cervical back: Normal range of motion and neck supple. No rigidity.  Lymphadenopathy:     Cervical: No cervical adenopathy.  Skin:    General: Skin is warm and dry.  Neurological:     Mental Status: She is alert.      LABORATORY DATA:  I have reviewed the data as listed Lab Results  Component Value Date   WBC 4.7 11/01/2022   HGB 14.3 11/01/2022   HCT 42.7 11/01/2022   MCV 85.4 11/01/2022   PLT 239 11/01/2022     Chemistry      Component Value Date/Time   NA 132 (L) 07/11/2022 1255   K 3.4 (L) 07/11/2022 1255   CL 89 (L) 07/11/2022 1255   CO2 29 07/11/2022 1255   BUN 13 07/11/2022 1255   CREATININE 0.72 07/11/2022 1255      Component Value Date/Time   CALCIUM 9.2 07/11/2022 1255   ALKPHOS 78 02/07/2022 1108   AST 22 02/07/2022 1108   ALT 14 02/07/2022 1108   BILITOT 1.3 (H) 02/07/2022 1108     Labs show significant improvement in anemia.   RADIOGRAPHIC  STUDIES: I have personally reviewed the radiological images as listed and agreed with the findings in the report. No results found.  All questions were answered. The patient knows to call the clinic with any problems, questions or concerns. I spent 20 minutes in the care of this patient including H and P, review of records, counseling and coordination of care.   RTC in 6 months.  Rachel Moulds, MD 11/01/2022 10:16 AM

## 2022-11-06 DIAGNOSIS — I10 Essential (primary) hypertension: Secondary | ICD-10-CM | POA: Diagnosis not present

## 2022-11-06 DIAGNOSIS — E785 Hyperlipidemia, unspecified: Secondary | ICD-10-CM | POA: Diagnosis not present

## 2022-11-06 DIAGNOSIS — D509 Iron deficiency anemia, unspecified: Secondary | ICD-10-CM | POA: Diagnosis not present

## 2022-11-06 DIAGNOSIS — Z780 Asymptomatic menopausal state: Secondary | ICD-10-CM | POA: Diagnosis not present

## 2022-11-15 DIAGNOSIS — S42202D Unspecified fracture of upper end of left humerus, subsequent encounter for fracture with routine healing: Secondary | ICD-10-CM | POA: Diagnosis not present

## 2022-11-20 ENCOUNTER — Other Ambulatory Visit (HOSPITAL_COMMUNITY): Payer: Self-pay

## 2022-11-20 ENCOUNTER — Other Ambulatory Visit: Payer: Self-pay

## 2022-11-20 ENCOUNTER — Other Ambulatory Visit (HOSPITAL_BASED_OUTPATIENT_CLINIC_OR_DEPARTMENT_OTHER): Payer: Self-pay

## 2022-11-20 DIAGNOSIS — R5383 Other fatigue: Secondary | ICD-10-CM | POA: Diagnosis not present

## 2022-11-20 DIAGNOSIS — L930 Discoid lupus erythematosus: Secondary | ICD-10-CM | POA: Diagnosis not present

## 2022-11-20 DIAGNOSIS — R0981 Nasal congestion: Secondary | ICD-10-CM | POA: Diagnosis not present

## 2022-11-20 DIAGNOSIS — R058 Other specified cough: Secondary | ICD-10-CM | POA: Diagnosis not present

## 2022-11-20 DIAGNOSIS — U071 COVID-19: Secondary | ICD-10-CM | POA: Diagnosis not present

## 2022-11-20 DIAGNOSIS — Z1152 Encounter for screening for COVID-19: Secondary | ICD-10-CM | POA: Diagnosis not present

## 2022-11-20 MED ORDER — DOXYCYCLINE HYCLATE 100 MG PO TABS
100.0000 mg | ORAL_TABLET | Freq: Two times a day (BID) | ORAL | 0 refills | Status: DC
  Filled 2022-11-20: qty 14, 7d supply, fill #0

## 2022-11-20 MED ORDER — LAGEVRIO 200 MG PO CAPS
4.0000 | ORAL_CAPSULE | Freq: Two times a day (BID) | ORAL | 0 refills | Status: DC
  Filled 2022-11-20: qty 40, 5d supply, fill #0

## 2022-11-27 DIAGNOSIS — D500 Iron deficiency anemia secondary to blood loss (chronic): Secondary | ICD-10-CM | POA: Diagnosis not present

## 2022-11-27 DIAGNOSIS — R7989 Other specified abnormal findings of blood chemistry: Secondary | ICD-10-CM | POA: Diagnosis not present

## 2022-11-27 DIAGNOSIS — E785 Hyperlipidemia, unspecified: Secondary | ICD-10-CM | POA: Diagnosis not present

## 2022-11-27 DIAGNOSIS — K219 Gastro-esophageal reflux disease without esophagitis: Secondary | ICD-10-CM | POA: Diagnosis not present

## 2022-11-27 DIAGNOSIS — I10 Essential (primary) hypertension: Secondary | ICD-10-CM | POA: Diagnosis not present

## 2022-11-30 DIAGNOSIS — L409 Psoriasis, unspecified: Secondary | ICD-10-CM | POA: Diagnosis not present

## 2022-11-30 DIAGNOSIS — Z0000 Encounter for general adult medical examination without abnormal findings: Secondary | ICD-10-CM | POA: Diagnosis not present

## 2022-11-30 DIAGNOSIS — F330 Major depressive disorder, recurrent, mild: Secondary | ICD-10-CM | POA: Diagnosis not present

## 2022-11-30 DIAGNOSIS — Z1331 Encounter for screening for depression: Secondary | ICD-10-CM | POA: Diagnosis not present

## 2022-11-30 DIAGNOSIS — E785 Hyperlipidemia, unspecified: Secondary | ICD-10-CM | POA: Diagnosis not present

## 2022-11-30 DIAGNOSIS — K219 Gastro-esophageal reflux disease without esophagitis: Secondary | ICD-10-CM | POA: Diagnosis not present

## 2022-11-30 DIAGNOSIS — Z1339 Encounter for screening examination for other mental health and behavioral disorders: Secondary | ICD-10-CM | POA: Diagnosis not present

## 2022-11-30 DIAGNOSIS — I10 Essential (primary) hypertension: Secondary | ICD-10-CM | POA: Diagnosis not present

## 2022-11-30 DIAGNOSIS — I700 Atherosclerosis of aorta: Secondary | ICD-10-CM | POA: Diagnosis not present

## 2022-12-20 ENCOUNTER — Other Ambulatory Visit: Payer: Self-pay | Admitting: Endocrinology

## 2022-12-20 DIAGNOSIS — N6314 Unspecified lump in the right breast, lower inner quadrant: Secondary | ICD-10-CM

## 2022-12-26 ENCOUNTER — Ambulatory Visit
Admission: RE | Admit: 2022-12-26 | Discharge: 2022-12-26 | Disposition: A | Discharge status: Home / Self Care | Payer: Medicare PPO | Source: Ambulatory Visit | Attending: Endocrinology | Admitting: Endocrinology

## 2022-12-26 ENCOUNTER — Other Ambulatory Visit: Payer: Self-pay | Admitting: Endocrinology

## 2022-12-26 DIAGNOSIS — N6314 Unspecified lump in the right breast, lower inner quadrant: Secondary | ICD-10-CM

## 2022-12-26 DIAGNOSIS — N6310 Unspecified lump in the right breast, unspecified quadrant: Secondary | ICD-10-CM

## 2022-12-31 ENCOUNTER — Other Ambulatory Visit: Payer: Medicare PPO

## 2023-01-10 ENCOUNTER — Other Ambulatory Visit: Payer: Medicare PPO

## 2023-01-15 ENCOUNTER — Ambulatory Visit
Admission: RE | Admit: 2023-01-15 | Discharge: 2023-01-15 | Disposition: A | Discharge status: Home / Self Care | Payer: Medicare PPO | Source: Ambulatory Visit | Attending: Endocrinology | Admitting: Endocrinology

## 2023-01-15 DIAGNOSIS — D241 Benign neoplasm of right breast: Secondary | ICD-10-CM | POA: Diagnosis not present

## 2023-01-15 DIAGNOSIS — N6310 Unspecified lump in the right breast, unspecified quadrant: Secondary | ICD-10-CM

## 2023-01-15 DIAGNOSIS — N6021 Fibroadenosis of right breast: Secondary | ICD-10-CM | POA: Diagnosis not present

## 2023-01-15 DIAGNOSIS — N6314 Unspecified lump in the right breast, lower inner quadrant: Secondary | ICD-10-CM | POA: Diagnosis not present

## 2023-01-15 HISTORY — PX: BREAST BIOPSY: SHX20

## 2023-01-21 DIAGNOSIS — L570 Actinic keratosis: Secondary | ICD-10-CM | POA: Diagnosis not present

## 2023-01-21 DIAGNOSIS — D1801 Hemangioma of skin and subcutaneous tissue: Secondary | ICD-10-CM | POA: Diagnosis not present

## 2023-01-21 DIAGNOSIS — L298 Other pruritus: Secondary | ICD-10-CM | POA: Diagnosis not present

## 2023-01-21 DIAGNOSIS — L821 Other seborrheic keratosis: Secondary | ICD-10-CM | POA: Diagnosis not present

## 2023-01-21 DIAGNOSIS — L930 Discoid lupus erythematosus: Secondary | ICD-10-CM | POA: Diagnosis not present

## 2023-01-21 DIAGNOSIS — L649 Androgenic alopecia, unspecified: Secondary | ICD-10-CM | POA: Diagnosis not present

## 2023-01-31 DIAGNOSIS — D241 Benign neoplasm of right breast: Secondary | ICD-10-CM | POA: Diagnosis not present

## 2023-02-07 ENCOUNTER — Telehealth: Payer: Self-pay | Admitting: Plastic Surgery

## 2023-02-07 NOTE — Telephone Encounter (Signed)
Alesha from Dr D[facility] has called to schedule appointment for patient Caroline Martin. She says that the patient needs to be seen soon as possible for a ruptured implant. I gave her Dr Ladona Ridgel first available in October, and she wants to know if the patient is able to been sooner because of the urgency.

## 2023-02-07 NOTE — Telephone Encounter (Signed)
Anaya,  For some reason my September 18th was blocked. I asked Melissa to open it. You can bring Ms Virl Cagey in on that day.   Thanks, Ree Kida

## 2023-02-11 ENCOUNTER — Institutional Professional Consult (permissible substitution): Payer: Medicare PPO | Admitting: Plastic Surgery

## 2023-02-20 ENCOUNTER — Institutional Professional Consult (permissible substitution): Payer: Medicare PPO | Admitting: Plastic Surgery

## 2023-02-28 DIAGNOSIS — D241 Benign neoplasm of right breast: Secondary | ICD-10-CM | POA: Diagnosis not present

## 2023-03-27 DIAGNOSIS — N6314 Unspecified lump in the right breast, lower inner quadrant: Secondary | ICD-10-CM | POA: Diagnosis not present

## 2023-03-27 DIAGNOSIS — D241 Benign neoplasm of right breast: Secondary | ICD-10-CM | POA: Diagnosis not present

## 2023-05-07 ENCOUNTER — Inpatient Hospital Stay: Payer: Medicare PPO | Attending: Hematology and Oncology | Admitting: Hematology and Oncology

## 2023-06-12 DIAGNOSIS — D369 Benign neoplasm, unspecified site: Secondary | ICD-10-CM | POA: Diagnosis not present

## 2023-06-12 DIAGNOSIS — L409 Psoriasis, unspecified: Secondary | ICD-10-CM | POA: Diagnosis not present

## 2023-06-12 DIAGNOSIS — E785 Hyperlipidemia, unspecified: Secondary | ICD-10-CM | POA: Diagnosis not present

## 2023-06-12 DIAGNOSIS — F330 Major depressive disorder, recurrent, mild: Secondary | ICD-10-CM | POA: Diagnosis not present

## 2023-06-12 DIAGNOSIS — I700 Atherosclerosis of aorta: Secondary | ICD-10-CM | POA: Diagnosis not present

## 2023-06-12 DIAGNOSIS — L930 Discoid lupus erythematosus: Secondary | ICD-10-CM | POA: Diagnosis not present

## 2023-06-12 DIAGNOSIS — I10 Essential (primary) hypertension: Secondary | ICD-10-CM | POA: Diagnosis not present

## 2023-07-11 ENCOUNTER — Telehealth: Payer: Self-pay | Admitting: Plastic Surgery

## 2023-07-11 NOTE — Telephone Encounter (Signed)
 Pt confirmed apt , called 07-11-23 at 432pm

## 2023-07-12 ENCOUNTER — Encounter: Payer: Self-pay | Admitting: Plastic Surgery

## 2023-07-12 ENCOUNTER — Ambulatory Visit: Payer: Medicare PPO | Admitting: Plastic Surgery

## 2023-07-12 VITALS — BP 154/92 | HR 100 | Ht 64.5 in | Wt 209.0 lb

## 2023-07-12 DIAGNOSIS — I700 Atherosclerosis of aorta: Secondary | ICD-10-CM | POA: Insufficient documentation

## 2023-07-12 DIAGNOSIS — L930 Discoid lupus erythematosus: Secondary | ICD-10-CM | POA: Diagnosis not present

## 2023-07-12 DIAGNOSIS — T8543XA Leakage of breast prosthesis and implant, initial encounter: Secondary | ICD-10-CM | POA: Insufficient documentation

## 2023-07-12 NOTE — Progress Notes (Signed)
 Patient ID: Caroline Martin, female    DOB: 1946-02-08, 78 y.o.   MRN: [id number]   Chief Complaint  Patient presents with   Advice Only   Skin Problem    The patient is a 78 year old female.  She is 5 feet 4 inches tall and weighs 209 pounds.  She is here for consultation for ruptured implant.  Her past medical history is listed below and most significant is lupus and psoriasis for which she is on medication.  She has a ruptured implant most likely bilaterally but I think documentation did on the right side.  The implants were placed in [place] 45 years ago.  She thinks that they are saline.  She is not a smoker and does not have diabetes.  She had some biopsies done which she thinks was when the implant ruptured.  It looks like she is got a fair amount of breast tissue with grade 3 ptosis of the breast.    Review of Systems  Constitutional: Negative.   Eyes: Negative.   Respiratory: Negative.    Cardiovascular: Negative.   Gastrointestinal: Negative.   Endocrine: Negative.   Genitourinary: Negative.   Musculoskeletal: Negative.     Past Medical History:  Diagnosis Date   Acid reflux occasional   Anemia    Anxiety    Arthritis    Cataract    Depression    DES exposure in utero    Hyperlipidemia    Hypertension    Lupus    Pneumonia    Psoriasis on back   SUI (stress urinary incontinence, female)     Past Surgical History:  Procedure Laterality Date   ANTERIOR CRUCIATE LIGAMENT REPAIR Right 2022   and meniscus repai   AUGMENTATION MAMMAPLASTY     BREAST BIOPSY Right 01/15/2023   US  RT BREAST BX W LOC DEV 1ST LESION IMG BX SPEC US  GUIDE 01/15/2023 [facility]   CATARACT EXTRACTION W/ INTRAOCULAR LENS IMPLANT Bilateral    CHOLECYSTECTOMY N/A 07/30/2016   Procedure: LAPAROSCOPIC CHOLECYSTECTOMY WITH INTRAOPERATIVE CHOLANGIOGRAM;  Surgeon: Morene Olives, MD;  Location: [facility];  Service: General;  Laterality: N/A;   CYSTOSCOPY  09/20/2011   Procedure:  PHYLLIS;  Surgeon: Glendia DELENA Elizabeth, MD;  Location[facility];  Service: Urology;  Laterality: N/A;  Monarc sling   ORIF HUMERUS FRACTURE Left 07/11/2022   Procedure: OPEN REDUCTION INTERNAL FIXATION (ORIF) PROXIMAL HUMERUS FRACTURE;  Surgeon: Kay Kemps, MD;  Location: [facility];  Service: Orthopedics;  Laterality: Left;  general, choice with interscalene block   PUBOVAGINAL SLING  06/04/2000   PUBOVAGINAL SLING  09/20/2011   Procedure: CARLOYN GLADE;  Surgeon: Glendia DELENA Elizabeth, MD;  Location: [facility];  Service: Urology;  Laterality: N/A;   TOTAL HIP ARTHROPLASTY Right       Current Outpatient Medications:    acetaminophen  (TYLENOL ) 500 MG tablet, Take 1,000 mg by mouth every 6 (six) hours as needed for mild pain., Disp: , Rfl:    B Complex Vitamins (VITAMIN-B COMPLEX PO), Take 1 capsule by mouth daily., Disp: , Rfl:    calcipotriene -betamethasone (TACLONEX) ointment, Apply 1 Application topically 2 (two) times daily as needed (psoriasis)., Disp: , Rfl:    cholecalciferol  (VITAMIN D) 400 units TABS tablet, Take 400 Units by mouth daily., Disp: , Rfl:    cimetidine (TAGAMET) 200 MG tablet, Take 200 mg by mouth daily as needed (heartburn)., Disp: , Rfl:    ciprofloxacin  (CIPRO ) 500 MG tablet, Take 500  mg by mouth 2 (two) times daily., Disp: , Rfl:    DULoxetine  (CYMBALTA ) 60 MG capsule, Take 60 mg by mouth daily., Disp: , Rfl:    ezetimibe -simvastatin  (VYTORIN ) 10-40 MG per tablet, Take 1 tablet by mouth at bedtime., Disp: , Rfl:    Ferrous Sulfate  (SLOW RELEASE IRON PO), Take 1 tablet by mouth every other day., Disp: , Rfl:    hydrochlorothiazide  (HYDRODIURIL ) 25 MG tablet, Take 25 mg by mouth daily., Disp: , Rfl:    hydroxychloroquine  (PLAQUENIL ) 200 MG tablet, Take 200 mg by mouth daily., Disp: , Rfl:    Multiple Vitamin (MULTIVITAMIN) tablet, Take 1 tablet by mouth daily., Disp: , Rfl:    ramipril  (ALTACE ) 10 MG tablet, Take 10 mg by mouth  daily., Disp: , Rfl:    Objective:   Vitals:   07/12/23 1004  BP: (!) 154/92  Pulse: 100  SpO2: 97%    Physical Exam Vitals and nursing note reviewed.  Constitutional:      Appearance: Normal appearance.  HENT:     Head: Normocephalic.  Cardiovascular:     Rate and Rhythm: Normal rate.     Pulses: Normal pulses.  Pulmonary:     Effort: Pulmonary effort is normal.  Abdominal:     Palpations: Abdomen is soft.  Musculoskeletal:        General: No swelling or deformity.  Skin:    General: Skin is warm.     Capillary Refill: Capillary refill takes less than 2 seconds.     Coloration: Skin is not jaundiced.     Findings: No bruising.  Neurological:     Mental Status: She is alert and oriented to person, place, and time.  Psychiatric:        Mood and Affect: Mood normal.        Behavior: Behavior normal.        Thought Content: Thought content normal.        Judgment: Judgment normal.     Assessment & Plan:  Discoid lupus erythematosus  Rupture of implant of right breast, initial encounter  The implants have been in for an extended period of time, 45 years, I think it is a good idea to remove them.  A mastopexy would be a nice aesthetic result.  I will send a quote but it is completely up to the patient.  Will plan on the capsule removal as well.  Will see if insurance will cover the removal of the capsule and the ruptured implants.  Pictures were obtained of the patient and placed in the chart with the patient's or guardian's permission.   Estefana RAMAN Dahlila Pfahler, DO

## 2023-08-14 DIAGNOSIS — H15102 Unspecified episcleritis, left eye: Secondary | ICD-10-CM | POA: Diagnosis not present

## 2023-09-24 DIAGNOSIS — H26492 Other secondary cataract, left eye: Secondary | ICD-10-CM | POA: Diagnosis not present

## 2023-09-24 DIAGNOSIS — Z961 Presence of intraocular lens: Secondary | ICD-10-CM | POA: Diagnosis not present

## 2023-09-24 DIAGNOSIS — H348322 Tributary (branch) retinal vein occlusion, left eye, stable: Secondary | ICD-10-CM | POA: Diagnosis not present

## 2023-10-10 DIAGNOSIS — H26491 Other secondary cataract, right eye: Secondary | ICD-10-CM | POA: Diagnosis not present

## 2023-10-10 DIAGNOSIS — Z961 Presence of intraocular lens: Secondary | ICD-10-CM | POA: Diagnosis not present

## 2023-10-10 DIAGNOSIS — H348322 Tributary (branch) retinal vein occlusion, left eye, stable: Secondary | ICD-10-CM | POA: Diagnosis not present

## 2023-11-13 DIAGNOSIS — M25562 Pain in left knee: Secondary | ICD-10-CM | POA: Diagnosis not present

## 2023-11-13 DIAGNOSIS — S93492A Sprain of other ligament of left ankle, initial encounter: Secondary | ICD-10-CM | POA: Diagnosis not present

## 2023-11-28 DIAGNOSIS — D500 Iron deficiency anemia secondary to blood loss (chronic): Secondary | ICD-10-CM | POA: Diagnosis not present

## 2023-11-28 DIAGNOSIS — I10 Essential (primary) hypertension: Secondary | ICD-10-CM | POA: Diagnosis not present

## 2023-11-28 DIAGNOSIS — Z1212 Encounter for screening for malignant neoplasm of rectum: Secondary | ICD-10-CM | POA: Diagnosis not present

## 2023-11-28 DIAGNOSIS — E785 Hyperlipidemia, unspecified: Secondary | ICD-10-CM | POA: Diagnosis not present

## 2023-12-05 DIAGNOSIS — F330 Major depressive disorder, recurrent, mild: Secondary | ICD-10-CM | POA: Diagnosis not present

## 2023-12-05 DIAGNOSIS — D369 Benign neoplasm, unspecified site: Secondary | ICD-10-CM | POA: Diagnosis not present

## 2023-12-05 DIAGNOSIS — I129 Hypertensive chronic kidney disease with stage 1 through stage 4 chronic kidney disease, or unspecified chronic kidney disease: Secondary | ICD-10-CM | POA: Diagnosis not present

## 2023-12-05 DIAGNOSIS — Z1331 Encounter for screening for depression: Secondary | ICD-10-CM | POA: Diagnosis not present

## 2023-12-05 DIAGNOSIS — N1831 Chronic kidney disease, stage 3a: Secondary | ICD-10-CM | POA: Diagnosis not present

## 2023-12-05 DIAGNOSIS — I700 Atherosclerosis of aorta: Secondary | ICD-10-CM | POA: Diagnosis not present

## 2023-12-05 DIAGNOSIS — L409 Psoriasis, unspecified: Secondary | ICD-10-CM | POA: Diagnosis not present

## 2023-12-05 DIAGNOSIS — R82998 Other abnormal findings in urine: Secondary | ICD-10-CM | POA: Diagnosis not present

## 2023-12-05 DIAGNOSIS — Z1339 Encounter for screening examination for other mental health and behavioral disorders: Secondary | ICD-10-CM | POA: Diagnosis not present

## 2023-12-05 DIAGNOSIS — L930 Discoid lupus erythematosus: Secondary | ICD-10-CM | POA: Diagnosis not present

## 2023-12-05 DIAGNOSIS — Z6834 Body mass index (BMI) 34.0-34.9, adult: Secondary | ICD-10-CM | POA: Diagnosis not present

## 2023-12-05 DIAGNOSIS — Z0000 Encounter for general adult medical examination without abnormal findings: Secondary | ICD-10-CM | POA: Diagnosis not present

## 2024-02-20 DIAGNOSIS — M25552 Pain in left hip: Secondary | ICD-10-CM | POA: Diagnosis not present

## 2024-03-16 DIAGNOSIS — L649 Androgenic alopecia, unspecified: Secondary | ICD-10-CM | POA: Diagnosis not present

## 2024-03-16 DIAGNOSIS — L821 Other seborrheic keratosis: Secondary | ICD-10-CM | POA: Diagnosis not present

## 2024-03-16 DIAGNOSIS — L738 Other specified follicular disorders: Secondary | ICD-10-CM | POA: Diagnosis not present

## 2024-03-16 DIAGNOSIS — L930 Discoid lupus erythematosus: Secondary | ICD-10-CM | POA: Diagnosis not present

## 2024-03-16 DIAGNOSIS — L820 Inflamed seborrheic keratosis: Secondary | ICD-10-CM | POA: Diagnosis not present

## 2024-04-23 DIAGNOSIS — Z961 Presence of intraocular lens: Secondary | ICD-10-CM | POA: Diagnosis not present

## 2024-04-23 DIAGNOSIS — B300 Keratoconjunctivitis due to adenovirus: Secondary | ICD-10-CM | POA: Diagnosis not present

## 2024-04-23 DIAGNOSIS — H26493 Other secondary cataract, bilateral: Secondary | ICD-10-CM | POA: Diagnosis not present

## 2024-04-23 DIAGNOSIS — H348322 Tributary (branch) retinal vein occlusion, left eye, stable: Secondary | ICD-10-CM | POA: Diagnosis not present

## 2024-04-24 DIAGNOSIS — H00033 Abscess of eyelid right eye, unspecified eyelid: Secondary | ICD-10-CM | POA: Diagnosis not present

## 2024-04-24 DIAGNOSIS — H00021 Hordeolum internum right upper eyelid: Secondary | ICD-10-CM | POA: Diagnosis not present

## 2024-04-27 DIAGNOSIS — H00021 Hordeolum internum right upper eyelid: Secondary | ICD-10-CM | POA: Diagnosis not present

## 2024-04-27 DIAGNOSIS — H00033 Abscess of eyelid right eye, unspecified eyelid: Secondary | ICD-10-CM | POA: Diagnosis not present
